# Patient Record
Sex: Male | Born: 1970
Health system: Southern US, Community
[De-identification: ages and names within clinical notes are randomized; demographics above are authoritative.]

## PROBLEM LIST (undated history)

## (undated) DIAGNOSIS — Z9989 Dependence on other enabling machines and devices: Secondary | ICD-10-CM

## (undated) DIAGNOSIS — K200 Eosinophilic esophagitis: Secondary | ICD-10-CM

## (undated) DIAGNOSIS — E785 Hyperlipidemia, unspecified: Secondary | ICD-10-CM

## (undated) DIAGNOSIS — E669 Obesity, unspecified: Secondary | ICD-10-CM

## (undated) DIAGNOSIS — K317 Polyp of stomach and duodenum: Secondary | ICD-10-CM

## (undated) DIAGNOSIS — G4733 Obstructive sleep apnea (adult) (pediatric): Secondary | ICD-10-CM

## (undated) DIAGNOSIS — K219 Gastro-esophageal reflux disease without esophagitis: Secondary | ICD-10-CM

## (undated) DIAGNOSIS — I10 Essential (primary) hypertension: Secondary | ICD-10-CM

## (undated) HISTORY — DX: Obesity, unspecified: E66.9

## (undated) HISTORY — PX: WISDOM TOOTH EXTRACTION: SHX21

## (undated) HISTORY — DX: Polyp of stomach and duodenum: K31.7

## (undated) HISTORY — DX: Obstructive sleep apnea (adult) (pediatric): G47.33

## (undated) HISTORY — DX: Hyperlipidemia, unspecified: E78.5

## (undated) HISTORY — DX: Essential (primary) hypertension: I10

## (undated) HISTORY — DX: Eosinophilic esophagitis: K20.0

## (undated) HISTORY — DX: Gastro-esophageal reflux disease without esophagitis: K21.9

## (undated) HISTORY — DX: Dependence on other enabling machines and devices: Z99.89

---

## 2003-11-25 ENCOUNTER — Emergency Department (HOSPITAL_COMMUNITY): Admission: EM | Admit: 2003-11-25 | Discharge: 2003-11-25 | Payer: Self-pay | Admitting: Emergency Medicine

## 2004-06-12 ENCOUNTER — Ambulatory Visit: Payer: Self-pay | Admitting: Internal Medicine

## 2005-04-13 ENCOUNTER — Ambulatory Visit (HOSPITAL_BASED_OUTPATIENT_CLINIC_OR_DEPARTMENT_OTHER): Admission: RE | Admit: 2005-04-13 | Discharge: 2005-04-13 | Payer: Self-pay | Admitting: Internal Medicine

## 2005-04-13 ENCOUNTER — Encounter: Payer: Self-pay | Admitting: Pulmonary Disease

## 2005-04-16 ENCOUNTER — Encounter: Payer: Self-pay | Admitting: Pulmonary Disease

## 2005-04-23 ENCOUNTER — Ambulatory Visit: Payer: Self-pay | Admitting: Pulmonary Disease

## 2005-04-30 ENCOUNTER — Ambulatory Visit: Payer: Self-pay | Admitting: Pulmonary Disease

## 2005-05-08 ENCOUNTER — Ambulatory Visit: Payer: Self-pay | Admitting: Pulmonary Disease

## 2005-09-27 ENCOUNTER — Ambulatory Visit: Payer: Self-pay | Admitting: Pulmonary Disease

## 2005-10-23 ENCOUNTER — Ambulatory Visit: Payer: Self-pay | Admitting: Internal Medicine

## 2005-11-22 ENCOUNTER — Ambulatory Visit: Payer: Self-pay | Admitting: Internal Medicine

## 2005-12-25 ENCOUNTER — Ambulatory Visit: Payer: Self-pay | Admitting: Pulmonary Disease

## 2006-04-19 ENCOUNTER — Ambulatory Visit: Payer: Self-pay | Admitting: Pulmonary Disease

## 2006-05-10 ENCOUNTER — Ambulatory Visit: Payer: Self-pay | Admitting: Internal Medicine

## 2006-05-24 ENCOUNTER — Ambulatory Visit: Payer: Self-pay | Admitting: Internal Medicine

## 2006-08-08 ENCOUNTER — Ambulatory Visit: Payer: Self-pay | Admitting: Internal Medicine

## 2006-08-08 LAB — CONVERTED CEMR LAB
Cholesterol: 207 mg/dL (ref 0–200)
Glucose, Bld: 99 mg/dL (ref 70–99)
HDL: 39.9 mg/dL (ref >39.0)
TSH: 1.11 microintl units/mL (ref 0.35–5.50)
Triglycerides: 84 mg/dL (ref 0–149)

## 2006-08-15 ENCOUNTER — Encounter: Payer: Self-pay | Admitting: Internal Medicine

## 2006-08-15 LAB — CONVERTED CEMR LAB
Dopamine 24 Hr Urine: 252 mcg/24hr (ref <500)
Epinephrine 24 Hr Urine: 5 mcg/24hr (ref <20)
Metaneph Total, Ur: 294 ug/24hr (ref 90–690)
Norepinephrine 24 Hr Urine: 36 mcg/24hr (ref <80)

## 2007-03-03 ENCOUNTER — Telehealth (INDEPENDENT_AMBULATORY_CARE_PROVIDER_SITE_OTHER): Payer: Self-pay | Admitting: *Deleted

## 2007-07-16 ENCOUNTER — Emergency Department (HOSPITAL_COMMUNITY): Admission: EM | Admit: 2007-07-16 | Discharge: 2007-07-17 | Payer: Self-pay | Admitting: Emergency Medicine

## 2007-07-22 ENCOUNTER — Ambulatory Visit: Payer: Self-pay | Admitting: Internal Medicine

## 2007-07-22 DIAGNOSIS — J45909 Unspecified asthma, uncomplicated: Secondary | ICD-10-CM

## 2007-08-08 ENCOUNTER — Telehealth (INDEPENDENT_AMBULATORY_CARE_PROVIDER_SITE_OTHER): Payer: Self-pay | Admitting: *Deleted

## 2007-10-03 ENCOUNTER — Telehealth (INDEPENDENT_AMBULATORY_CARE_PROVIDER_SITE_OTHER): Payer: Self-pay | Admitting: *Deleted

## 2007-10-14 ENCOUNTER — Ambulatory Visit: Payer: Self-pay | Admitting: Pulmonary Disease

## 2007-10-14 DIAGNOSIS — G4733 Obstructive sleep apnea (adult) (pediatric): Secondary | ICD-10-CM | POA: Insufficient documentation

## 2007-10-15 ENCOUNTER — Ambulatory Visit (HOSPITAL_BASED_OUTPATIENT_CLINIC_OR_DEPARTMENT_OTHER): Admission: RE | Admit: 2007-10-15 | Discharge: 2007-10-15 | Payer: Self-pay | Admitting: Pulmonary Disease

## 2007-10-15 ENCOUNTER — Encounter: Payer: Self-pay | Admitting: Pulmonary Disease

## 2007-10-23 ENCOUNTER — Ambulatory Visit: Payer: Self-pay | Admitting: Pulmonary Disease

## 2007-10-31 ENCOUNTER — Telehealth: Payer: Self-pay | Admitting: Pulmonary Disease

## 2007-10-31 ENCOUNTER — Telehealth (INDEPENDENT_AMBULATORY_CARE_PROVIDER_SITE_OTHER): Payer: Self-pay | Admitting: *Deleted

## 2007-10-31 ENCOUNTER — Encounter (INDEPENDENT_AMBULATORY_CARE_PROVIDER_SITE_OTHER): Payer: Self-pay | Admitting: *Deleted

## 2007-11-18 ENCOUNTER — Ambulatory Visit: Payer: Self-pay | Admitting: Pulmonary Disease

## 2009-01-25 ENCOUNTER — Ambulatory Visit: Payer: Self-pay | Admitting: Internal Medicine

## 2009-01-25 DIAGNOSIS — F429 Obsessive-compulsive disorder, unspecified: Secondary | ICD-10-CM | POA: Insufficient documentation

## 2009-01-25 DIAGNOSIS — E785 Hyperlipidemia, unspecified: Secondary | ICD-10-CM

## 2009-01-25 LAB — CONVERTED CEMR LAB: HDL goal, serum: 40 mg/dL

## 2009-02-06 LAB — CONVERTED CEMR LAB: TSH: 2 microintl units/mL (ref 0.35–5.50)

## 2009-02-08 ENCOUNTER — Encounter (INDEPENDENT_AMBULATORY_CARE_PROVIDER_SITE_OTHER): Payer: Self-pay | Admitting: *Deleted

## 2009-02-23 ENCOUNTER — Encounter: Payer: Self-pay | Admitting: Internal Medicine

## 2009-02-23 ENCOUNTER — Telehealth (INDEPENDENT_AMBULATORY_CARE_PROVIDER_SITE_OTHER): Payer: Self-pay | Admitting: *Deleted

## 2010-01-18 ENCOUNTER — Encounter: Payer: Self-pay | Admitting: Internal Medicine

## 2010-04-26 ENCOUNTER — Telehealth: Payer: Self-pay | Admitting: Family

## 2010-04-26 ENCOUNTER — Ambulatory Visit: Payer: Self-pay | Admitting: Family

## 2010-04-26 DIAGNOSIS — H6690 Otitis media, unspecified, unspecified ear: Secondary | ICD-10-CM | POA: Insufficient documentation

## 2010-05-01 ENCOUNTER — Telehealth: Payer: Self-pay | Admitting: Family

## 2010-05-02 ENCOUNTER — Telehealth: Payer: Self-pay | Admitting: Family

## 2010-07-04 ENCOUNTER — Ambulatory Visit
Admission: RE | Admit: 2010-07-04 | Discharge: 2010-07-04 | Payer: Self-pay | Source: Home / Self Care | Attending: Internal Medicine | Admitting: Internal Medicine

## 2010-07-04 ENCOUNTER — Ambulatory Visit: Admit: 2010-07-04 | Payer: Self-pay | Admitting: Internal Medicine

## 2010-07-04 DIAGNOSIS — J069 Acute upper respiratory infection, unspecified: Secondary | ICD-10-CM | POA: Insufficient documentation

## 2010-07-04 DIAGNOSIS — J45901 Unspecified asthma with (acute) exacerbation: Secondary | ICD-10-CM | POA: Insufficient documentation

## 2010-07-04 NOTE — Progress Notes (Signed)
Summary: Call A Nurse  Phone Note Call from Patient   Caller: [name]  Call a Nurse 04/29/10 Call For: Bryan Craze, NP Summary of Call: Patient calling. States he was seen in office 04/26/10 by Dr.Sullivan for cough, congestion. States was dx with ear infection. States was prescribed Amoxicllin 04/26/10. Patient states cough and congestion are worse. States expectorated bloody sputum 04/29/10 X 4. Afebrile. States mild wheezing. Patient advied to be seen in UC. Patient agreeable. States will go to [facility] for evaluation Initial call taken by: Mervin Kung CMA Duncan Dull),  May 01, 2010 4:54 PM  Follow-up for Phone Call        Called patient's home to follow up with patient.  Spoke with pt's wife, was given cell number [phone].  No answer on cell, left message for patient to return our call.   Follow-up by: Lemont Fillers FNP,  May 01, 2010 5:06 PM

## 2010-07-04 NOTE — Medication Information (Signed)
Summary: Care Consideration for Advair/[employer]  Care Consideration for Advair/[employer]   Imported By: Sherian Rein 02/24/2010 14:27:04  _____________________________________________________________________  External Attachment:    Type:   Image     Comment:   External Document

## 2010-07-04 NOTE — Progress Notes (Signed)
  Spoke to pharmacist at [facility] and gave verbal rx for Cipro.    ---- Converted from flag ---- ---- 04/26/2010 9:28 AM, Mervin Kung CMA (AAMA) wrote: Failed NewRx: Problem in sending NewRx Message to Central.;  Dr Name: Lemont Fillers, Problem in sending NewRx message to Central.;  Pharmacy: [facility] [phone]*;  Medication: AMOXICILLIN 500 MG CAPS;  Instructions: 2 caps by mouth three times a day for 10 days;  Quantity: 60 Capsule;  Refills: 0;  Entry Date: [id number];  Transaction ID: [id number];   ------------------------------  Phone Note Outgoing Call   Call placed by: Lemont Fillers FNP,  April 26, 2010 11:54 AM Call placed to: pharmacy Summary of Call: confirmed that they have rx for amoxicillin 500mg  2 caps by mouth three times a day x 10 days, not cipro. Initial call taken by: Lemont Fillers FNP,  April 26, 2010 11:55 AM

## 2010-07-04 NOTE — Assessment & Plan Note (Signed)
Summary: congested.cbs--Rm 5   Vital Signs:  Patient profile:   40 year old male Height:      70 inches Weight:      223.25 pounds BMI:     32.15 Temp:     97.9 degrees F oral Pulse rate:   78 / minute Pulse rhythm:   regular Resp:     16 per minute BP sitting:   118 / 86  (right arm) Cuff size:   large  Vitals Entered By: Mervin Kung CMA Duncan Dull) (April 26, 2010 9:03 AM) CC: Pt states he has had head congestion x 1 week. Has productive cough and right ear feels full. Is Patient Diabetic? No Pain Assessment Patient in pain? no      Comments Pt states he is only using Advair once a day. Nicki Guadalajara Fergerson CMA Duncan Dull)  April 26, 2010 9:15 AM    Primary Care Provider:  Lona Kettle  CC:  Pt states he has had head congestion x 1 week. Has productive cough and right ear feels full.Marland Kitchen  History of Present Illness: Mr Mecca is a 39 year old male who presents today for evaluation of chest congestion.  Symptoms started week ago monday.  Nasal congestion with clear nasal discharge.  +  cough productive of yellow sputum.   R ear feels full.   + Lethargy.  Denies fever.  Has tried OTC Dayquil, nasal decongestants- little improvement.  Asthma- reports that he has been using pro air 4-5 times a day.  Feels wheezy/tight.  Allergies: 1)  ! Jonne Ply  Past History:  Past Medical History: Last updated: 01/25/2009 OSA on CPAP asthma ; OCD (Fluoxetine), Dr Evelene Croon Hyperlipidemia  Past Surgical History: Last updated: 01/25/2009 Wisdom Teeth Extraction  Physical Exam  General:  Well-developed,well-nourished,in no acute distress; alert,appropriate and cooperative throughout examination Head:  Normocephalic and atraumatic without obvious abnormalities. No apparent alopecia or balding. Eyes:  PERRLA Ears:  R TM + erythema, mild bulging.  L TM with serous effusion no bulging Mouth:  Oral mucosa and oropharynx without lesions or exudates.  Teeth in good repair. Neck:  No deformities,  masses, or tenderness noted. Lungs:  Normal respiratory effort, chest expands symmetrically. Lungs are clear to auscultation, no crackles or wheezes. Heart:  Normal rate and regular rhythm. S1 and S2 normal without gallop, murmur, click, rub or other extra sounds. Psych:  Cognition and judgment appear intact. Alert and cooperative with normal attention span and concentration. No apparent delusions, illusions, hallucinations   Impression & Recommendations:  Problem # 1:  OTITIS MEDIA, ACUTE, LEFT (ICD-382.9) Assessment New Will plan to treat with amoxicillin.  Pt instructed to use tylenol as needed for pain, call if symptoms worsen or if not improved in 48 hours His updated medication list for this problem includes:    Amoxicillin 500 Mg Caps (Amoxicillin) .Marland Kitchen... 2 caps by mouth three times a day for 10 days  Problem # 2:  ASTHMA (ICD-493.90) Assessment: Unchanged Lung exam WNL.  Continue advair and proair.  Advair samples provided  Lot [id number], exp 05/2011 His updated medication list for this problem includes:    Proair Hfa 108 (90 Base) Mcg/act Aers (Albuterol sulfate) .Marland Kitchen... 2 puffs as needed    Advair Diskus 250-50 Mcg/dose Misc (Fluticasone-salmeterol) ..... One inhalation every 12 hours  gargle and swallow after use  Complete Medication List: 1)  Proair Hfa 108 (90 Base) Mcg/act Aers (Albuterol sulfate) .... 2 puffs as needed 2)  Advair Diskus 250-50 Mcg/dose Misc (Fluticasone-salmeterol) .... One  inhalation every 12 hours  gargle and swallow after use 3)  Prozac 40 Mg Caps (Fluoxetine hcl) .... Take 1 tablet by mouth once a day. 4)  Cpap  5)  Nuvigil 250 Mg Tabs (Armodafinil) .... Take 1 tablet by mouth every morning. 6)  Amoxicillin 500 Mg Caps (Amoxicillin) .... 2 caps by mouth three times a day for 10 days  Patient Instructions: 1)  Call if symptoms worsen or do not improve. 2)  Have a nice Thanksgiving! Prescriptions: ADVAIR DISKUS 250-50 MCG/DOSE MISC  (FLUTICASONE-SALMETEROL) ONE INHALATION EVERY 12 HOURS  GARGLE AND SWALLOW AFTER USE  #2 x 0   Entered and Authorized by:   Lemont Fillers FNP   Signed by:   Lemont Fillers FNP on 04/26/2010   Method used:   Samples Given   RxID:   [id number] AMOXICILLIN 500 MG CAPS (AMOXICILLIN) 2 caps by mouth three times a day for 10 days  #60 x 0   Entered and Authorized by:   Lemont Fillers FNP   Signed by:   Lemont Fillers FNP on 04/26/2010   Method used:   Electronically to        [facility] [phone]* (retail)       [address]       [place], [place]  [address]       Ph: [phone]       Fax: [phone]   RxID:   [id number]    Orders Added: 1)  Est. Patient Level IV [34742]    Current Allergies (reviewed today): ! ASA   Appended Document: congested.cbs--Rm 5 Correction-  Diagnosis should read Right Otitis media, not Left otitis media.

## 2010-07-04 NOTE — Progress Notes (Signed)
Summary: Status Update--  Phone Note Call from Patient Call back at [phone]   Caller: Patient Call For: Marga Melnick MD Summary of Call: patient states he was seen by another doctor  on Saturday- [facility] in [place]  and  he was given a Zpak. He states he was also advised to continue with antibiotics that was prescribed in addition to Zpak. He states his cough has not improved and he has been coughing up blood, there has not been a significant amount. He state that he was diagnosed with Bronchitis, he feels like there has been no change in his symptoms. He would like to know if there is anything additional that he will need to do. He denies temperature and states he is concerned more about the cough.  Initial call taken by: Glendell Docker CMA,  May 02, 2010 4:18 PM  Follow-up for Phone Call        I think that the patient should be re-evaluated since his symptoms are not improving.  He will need a chest x-ray to exclude pneumonia.  If Dr. Alwyn Ren is booked, then I am happy to see him. Follow-up by: Lemont Fillers FNP,  May 03, 2010 8:14 AM  Additional Follow-up for Phone Call Additional follow up Details #1::        Left message on machine to return my call. Nicki Guadalajara Fergerson CMA Duncan Dull)  May 03, 2010 9:10 AM     Additional Follow-up for Phone Call Additional follow up Details #2::    Spoke to pt. He states that he feels his symptoms have stablized at this point. Does not feel that he is getting worse. States that the urgent care doctor he saw mentioned an xray but said to hold off for now. Pt states he will give it a few more days and if he is not better will give [place] a call. Nicki Guadalajara Fergerson CMA Duncan Dull)  May 04, 2010 8:18 AM

## 2010-07-07 ENCOUNTER — Telehealth: Payer: Self-pay | Admitting: Internal Medicine

## 2010-07-10 ENCOUNTER — Telehealth: Payer: Self-pay | Admitting: Internal Medicine

## 2010-07-12 NOTE — Assessment & Plan Note (Signed)
Summary: sinus, cold, chest congestion///sph   Vital Signs:  Patient profile:   40 year old male Weight:      227.8 pounds BMI:     32.80 Temp:     99.0 degrees F oral Pulse rate:   92 / minute Resp:     15 per minute BP sitting:   122 / 78  (left arm) Cuff size:   large  Vitals Entered By: Shonna Chock CMA (July 04, 2010 8:13 AM) CC: Sinuses, cold and chest congestion since 05/2010 (patient seen x 3). [facility] , URI symptoms, Cough, COPD follow-up   Primary Care Provider:  Lona Kettle  CC:  Sinuses, cold and chest congestion since 05/2010 (patient seen x 3). [facility] at [facility] , URI symptoms, Cough, and COPD follow-up.  History of Present Illness:    Intermittent RTI  since "otitis" in mid Dec 2011. He has also  been seen a total of 3 additional times @ UC in [place]. CXray was done for hemoptysis in mid January. Yesterday he was told he may have " pre pneumonia"..  The patient  now reports clear nasal discharge and productive cough, but denies nasal congestion and earache.  Associated symptoms include dyspnea and wheezing.  The patient denies fever.  The patient also reports sneezing and headache.  The patient denies itchy watery eyes.  Risk factors for Strep sinusitis include bilateral facial pain.  The patient denies the following risk factors for Strep sinusitis: tooth pain and tender adenopathy. The patient reports pleuritic chest pain and  streaky hemoptysis.  The patient denies the following symptoms: acid reflux symptoms.  The cough is worse with lying down and cold exposure.  Medication use includes quick relief med , albuterol  3-4X/day daily and controller med, Advair 250/50 only once  daily in am .    Current Medications (verified): 1)  Proair Hfa 108 (90 Base) Mcg/act Aers (Albuterol Sulfate) .... 2 Puffs As Needed 2)  Advair Diskus 250-50 Mcg/dose Misc (Fluticasone-Salmeterol) .... One Inhalation Every 12 Hours  Gargle and Swallow After  Use 3)  Prozac 40 Mg Caps (Fluoxetine Hcl) .... Take 1 Tablet By Mouth Once A Day. 4)  Cpap 5)  Nuvigil 250 Mg Tabs (Armodafinil) .... Take 1 Tablet By Mouth Every Morning. 6)  Singulair 10 Mg Tabs (Montelukast Sodium) .Marland Kitchen.. 1 By Mouth Once Daily 7)  Doxycycline Hyclate 100 Mg Caps (Doxycycline Hyclate) .Marland Kitchen.. 1 By Mouth Two Times A Day, Rx'ed At [facility] 8)  Hydrocodone-Homatropine 5-1.5 Mg/4ml Syrp (Hydrocodone-Homatropine) .Marland Kitchen.. 1 Tablespoon At Night  Allergies: 1)  ! Asa  Review of Systems Psych:  He requests I fill meds now Rxed by Dr Evelene Croon as copay is high. I informed him  I would need statement from Dr Evelene Croon that he is stable on this regimen & no changes anticipated.. Endo:  Complains of weight change; Options for weight loss discussed ( avoidance of HFCS sugar; low glycemic  carb diet) & he was instructed in  label reading .  Physical Exam  General:  well-nourished,in no acute distress; alert,appropriate and cooperative throughout examination Eyes:  Slight OD scleral bleeding Ears:  External ear exam shows no significant lesions or deformities.  Otoscopic examination reveals clear canals, tympanic membranes are intact bilaterally without bulging, retraction, inflammation or discharge. Hearing is grossly normal bilaterally. Some wax bilaterally Nose:  External nasal examination shows no deformity or inflammation. Nasal mucosa are  dry & erythematous without lesions or exudates. Mouth:  Oral mucosa and oropharynx without lesions or exudates.  Teeth in good repair. Lungs:  Normal respiratory effort, chest expands symmetrically. Lungs : diffuse, homogenous coarse rhonchi &  wheezes. Heart:  Normal rate and regular rhythm. S1 and S2 normal without gallop, murmur, click, rub. Extremities:  No clubbing, cyanosis, edema.   Cervical Nodes:  No lymphadenopathy noted Axillary Nodes:  No palpable lymphadenopathy   Impression & Recommendations:  Problem # 1:  BRONCHITIS-ACUTE (ICD-466.0) R/O  PNA based on his understanding of Xray findings from UC The following medications were removed from the medication list:    Amoxicillin 500 Mg Caps (Amoxicillin) .Marland Kitchen... 2 caps by mouth three times a day for 10 days His updated medication list for this problem includes:    Proair Hfa 108 (90 Base) Mcg/act Aers (Albuterol sulfate) .Marland Kitchen... 2 puffs as needed    Advair Diskus 250-50 Mcg/dose Misc (Fluticasone-salmeterol) ..... One inhalation every 12 hours  gargle and swallow after use    Singulair 10 Mg Tabs (Montelukast sodium) .Marland Kitchen... 1 by mouth once daily    Doxycycline Hyclate 100 Mg Caps (Doxycycline hyclate) .Marland Kitchen... 1 by mouth two times a day, [facility] at [facility]    Hydrocodone-homatropine 5-1.5 Mg/30ml Syrp (Hydrocodone-homatropine) .Marland Kitchen... 1 tablespoon at night    Dulera 200-5 Mcg/act Aero (Mometasone furo-formoterol fum) .Marland Kitchen... 1-2 puffs every 12 hrs as needed ; gargle after use (inplace of advair)    Singulair 10 Mg Tabs (Montelukast sodium) .Marland Kitchen... 1 once daily  Orders: T-2 View CXR (71020TC)  Problem # 2:  ASTHMA NOS W/ACUTE EXACERBATION (ICD-493.92) Pathophysiology of asthma & assessment of control ( "Rules of Two") discussed His updated medication list for this problem includes:    Proair Hfa 108 (90 Base) Mcg/act Aers (Albuterol sulfate) .Marland Kitchen... 2 puffs as needed    Advair Diskus 250-50 Mcg/dose Misc (Fluticasone-salmeterol) ..... One inhalation every 12 hours  gargle and swallow after use    Singulair 10 Mg Tabs (Montelukast sodium) .Marland Kitchen... 1 by mouth once daily    Dulera 200-5 Mcg/act Aero (Mometasone furo-formoterol fum) .Marland Kitchen... 1-2 puffs every 12 hrs as needed ; gargle after use (inplace of advair)    Singulair 10 Mg Tabs (Montelukast sodium) .Marland Kitchen... 1 once daily    Prednisone 20 Mg Tabs (Prednisone) .Marland Kitchen... 1 two times a day with food  Orders: T-2 View CXR (71020TC)  Problem # 3:  URI (ICD-465.9)  His updated medication list for this problem includes:    Hydrocodone-homatropine 5-1.5 Mg/72ml  Syrp (Hydrocodone-homatropine) .Marland Kitchen... 1 tablespoon at night  Complete Medication List: 1)  Proair Hfa 108 (90 Base) Mcg/act Aers (Albuterol sulfate) .... 2 puffs as needed 2)  Advair Diskus 250-50 Mcg/dose Misc (Fluticasone-salmeterol) .... One inhalation every 12 hours  gargle and swallow after use 3)  Prozac 40 Mg Caps (Fluoxetine hcl) .... Take 1 tablet by mouth once a day. 4)  Cpap  5)  Nuvigil 250 Mg Tabs (Armodafinil) .... Take 1 tablet by mouth every morning. 6)  Singulair 10 Mg Tabs (Montelukast sodium) .Marland Kitchen.. 1 by mouth once daily 7)  Doxycycline Hyclate 100 Mg Caps (Doxycycline hyclate) .Marland Kitchen.. 1 by mouth two times a day, [facility] at [facility] 8)  Hydrocodone-homatropine 5-1.5 Mg/24ml Syrp (Hydrocodone-homatropine) .Marland Kitchen.. 1 tablespoon at night 9)  Dulera 200-5 Mcg/act Aero (Mometasone furo-formoterol fum) .Marland Kitchen.. 1-2 puffs every 12 hrs as needed ; gargle after use (inplace of advair) 10)  Singulair 10 Mg Tabs (Montelukast sodium) .Marland Kitchen.. 1 once daily 11)  Prednisone 20 Mg Tabs (Prednisone) .Marland KitchenMarland KitchenMarland Kitchen 1  two times a day with food  Patient Instructions: 1)  Neti pot once daily - two times a day as needed for congestion.  2)  Drink as much NON dairy  fluid as you can tolerate for the next few days. Finish Doxycycline. Prescriptions: HYDROCODONE-HOMATROPINE 5-1.5 MG/5ML SYRP (HYDROCODONE-HOMATROPINE) 1 tablespoon at night  #120 cc x 0   Entered and Authorized by:   Marga Melnick MD   Signed by:   Marga Melnick MD on 07/04/2010   Method used:   Print then Give to Patient   RxID:   [id number] PREDNISONE 20 MG TABS (PREDNISONE) 1 two times a day with food  #14 x 0   Entered and Authorized by:   Marga Melnick MD   Signed by:   Marga Melnick MD on 07/04/2010   Method used:   Print then Give to Patient   RxID:   [id number] SINGULAIR 10 MG TABS (MONTELUKAST SODIUM) 1 once daily  #30 x 5   Entered and Authorized by:   Marga Melnick MD   Signed by:   Marga Melnick MD on 07/04/2010   Method  used:   Print then Give to Patient   RxID:   [id number] DULERA 200-5 MCG/ACT AERO (MOMETASONE FURO-FORMOTEROL FUM) 1-2 puffs every 12 hrs as needed ; gargle after use (inplace of Advair)  #1 x 5   Entered and Authorized by:   Marga Melnick MD   Signed by:   Marga Melnick MD on 07/04/2010   Method used:   Print then Give to Patient   RxID:   [id number]    Orders Added: 1)  Est. Patient Level IV [63016] 2)  T-2 View CXR [71020TC]

## 2010-07-12 NOTE — Progress Notes (Signed)
Summary: RX clarification  Phone Note Refill Request Message from:  Pharmacy on July 07, 2010 10:13 AM  Refills Requested: Medication #1:  HYDROCODONE-HOMATROPINE 5-1.5 MG/5ML SYRP 1 tablespoon at night Pharmacy called about the above prescription and would like to confirm if the patient should take one tablespoon as written or should the prescription be for one teaspoon. Please advise.  Initial call taken by: Lucious Groves CMA,  July 07, 2010 10:13 AM  Follow-up for Phone Call        Per MD: patient will take 1 tsp q 6hrs as needed and may cause sedation.   Pharmacist notified.  Follow-up by: Lucious Groves CMA,  July 07, 2010 11:24 AM

## 2010-07-18 ENCOUNTER — Telehealth: Payer: Self-pay | Admitting: Internal Medicine

## 2010-07-20 NOTE — Progress Notes (Signed)
Summary: Cough med  Phone Note Call from Patient Call back at [phone]   Summary of Call: Patient notes that he is using the cough med once in the AM and once in PM. He notes that it does help, but seems to not last long enough. Patient doesn't want to abuse the med, but notes that he continues to cough. She he come in or is there something else he can try? Please advise. Initial call taken by: Lucious Groves CMA,  July 10, 2010 2:01 PM  Follow-up for Phone Call        renew cough syrup ; 1 tsp every 6 hrs as needed  .120cc. OVINB Follow-up by: Marga Melnick MD,  July 10, 2010 3:27 PM  Additional Follow-up for Phone Call Additional follow up Details #1::        Patient notified. Additional Follow-up by: Lucious Groves CMA,  July 10, 2010 4:00 PM    New/Updated Medications: HYDROMET 5-1.5 MG/5ML SYRP (HYDROCODONE-HOMATROPINE) 1 tsp every 6 hrs as needed Prescriptions: HYDROMET 5-1.5 MG/5ML SYRP (HYDROCODONE-HOMATROPINE) 1 tsp every 6 hrs as needed  #120 cc x 0   Entered and Authorized by:   Marga Melnick MD   Signed by:   Marga Melnick MD on 07/10/2010   Method used:   Printed then faxed to ...       [facility] [address] [phone]* (retail)       [address]       [place], [place]  [address]       Ph: [phone]       Fax: [phone]   RxID:   [id number]

## 2010-07-26 NOTE — Progress Notes (Signed)
Summary: FYI--Feeling better  Phone Note Call from Patient   Summary of Call: Patient called with FYI that he is much better and his cough has gone away. Thanks for everything you've done.  Initial call taken by: Lucious Groves CMA,  July 18, 2010 8:08 AM

## 2010-09-26 ENCOUNTER — Telehealth: Payer: Self-pay | Admitting: Pulmonary Disease

## 2010-09-26 NOTE — Telephone Encounter (Signed)
Pls advise.  

## 2010-09-26 NOTE — Telephone Encounter (Signed)
Spouse requests that dr clance see pt asap for asthma. [facility]c has seen pt for sleep. i explained that we would need her to call dr hopper and get a referral as this is "normally" how kc sees pulm consults. Caller says that she spoke to libby and was told that we could send a note to kc re: this req. pls advise as the 1st avail cons avail. Is 10/17/10. (also advise if this will be a new consult- if not- pt can be seen sooner since spouse wants pt in this week asap). Call home [phone]- ok to leave msg per spouse tina.

## 2010-09-27 ENCOUNTER — Telehealth: Payer: Self-pay | Admitting: *Deleted

## 2010-09-27 MED ORDER — MONTELUKAST SODIUM 10 MG PO TABS: 10.0000 mg | ORAL_TABLET | Freq: Every day | ORAL | Status: DC

## 2010-09-27 MED ORDER — MOMETASONE FURO-FORMOTEROL FUM 200-5 MCG/ACT IN AERO: 2.0000 | INHALATION_SPRAY | Freq: Two times a day (BID) | RESPIRATORY_TRACT | Status: DC | PRN

## 2010-09-27 NOTE — Telephone Encounter (Signed)
Called and spoke with pt's wife, Inetta Fermo.  SCheduled pt to see [facility] next Wed 10-04-2010 at 4pm.  Wife aware for pt to arrive at 3:50 to fill out paperwork.

## 2010-09-27 NOTE — Telephone Encounter (Signed)
Would be happy to see as a pulmonary self referral (new evaluation).  Please get with megan and see when this can be done.  Unlikely this week, but you never know

## 2010-09-27 NOTE — Telephone Encounter (Signed)
Refill both X 6 months please

## 2010-09-27 NOTE — Telephone Encounter (Signed)
[place], the first available consult with [facility] is not until 10/17/10. Do you think we could work him in sooner that this? He has some openings before this date, but just not 30 min slot. Pls advise thanks

## 2010-09-27 NOTE — Telephone Encounter (Signed)
Spoke w/ pt informed that he could just called pulmonary and request another physician for second opinion. Also refills sent to pharmacy.

## 2010-10-02 ENCOUNTER — Encounter: Payer: Self-pay | Admitting: Pulmonary Disease

## 2010-10-03 HISTORY — PX: WRIST SURGERY: SHX841

## 2010-10-04 ENCOUNTER — Institutional Professional Consult (permissible substitution): Payer: Self-pay | Admitting: Pulmonary Disease

## 2010-10-17 ENCOUNTER — Ambulatory Visit (HOSPITAL_BASED_OUTPATIENT_CLINIC_OR_DEPARTMENT_OTHER)
Admission: RE | Admit: 2010-10-17 | Discharge: 2010-10-17 | Disposition: A | Discharge status: Home / Self Care | Payer: Managed Care, Other (non HMO) | Source: Ambulatory Visit | Attending: Orthopedic Surgery | Admitting: Orthopedic Surgery

## 2010-10-17 DIAGNOSIS — M6740 Ganglion, unspecified site: Secondary | ICD-10-CM | POA: Insufficient documentation

## 2010-10-17 DIAGNOSIS — Z01812 Encounter for preprocedural laboratory examination: Secondary | ICD-10-CM | POA: Insufficient documentation

## 2010-10-17 DIAGNOSIS — J45909 Unspecified asthma, uncomplicated: Secondary | ICD-10-CM | POA: Insufficient documentation

## 2010-10-17 NOTE — Procedures (Signed)
[name], Barber NO.:  [id number]   MEDICAL RECORD NO.:  [id number]          PATIENT TYPE:  OUT   LOCATION:  SLEEP CENTER                 FACILITY[facility]  [facility]   PHYSICIAN:  Barbaraann Share, MD,FCCPDATE OF BIRTH:  February 20[birth year] 1973   DATE OF STUDY:  10/15/2007                            NOCTURNAL POLYSOMNOGRAM   REFERRING PHYSICIAN:  Barbaraann Share, MD,FCCP   REFERRING PHYSICIAN:  Barbaraann Share, MD   INDICATION FOR STUDY:  Hypersomnia with sleep apnea.  The patient is  having difficulty with CPAP and needs pressure optimization.   EPWORTH SLEEPINESS SCORE:  9.   SLEEP ARCHITECTURE:  The patient had total sleep time of 290 minutes,  however had minimal slow-wave sleep and never achieved REM.  Sleep onset  latency was normal and sleep efficiency was decreased at 82%.   RESPIRATORY DATA:  The patient underwent formal CPAP titration and was  tried on numerous types of masks prior to the procedure.  He ultimately  chose his home mask, which is a Child psychotherapist.  During this study a  chin strap had to be added due to mouth opening.  At 3:30 a.m. the  patient was switched over to a full-face Quattro mask because of  discomfort from the chin strap.  The patient's pressure was started at 5  cmH2O and ultimately increased to 11 cm for snoring as well as  respiratory effort-related arousals.   OXYGEN DATA:  There was transient O2 desaturation to 93%.   CARDIAC DATA:  No clinically significant arrhythmias were noted.   MOVEMENT-PARASOMNIA:  No periodic leg movements or abnormal behaviors  noted.   IMPRESSIONS-RECOMMENDATIONS:  CPAP titration reveals good control of  sleep-disordered breathing with 11 cm of CPAP pressure.  The patient was  tried on different masks and had difficulty with the Nasal Swift because  of mouth opening.  He could not tolerate a chin strap because of  discomfort and ultimately ended up on a Quattro full-face mask.  This  will have to be  discussed with the patient to determine which CPAP mask  will be adequate for  him.  The patient should also be encouraged to work on weight loss.  It  should be noted the patient never achieved REM during then night.      Barbaraann Share, MD,FCCP  Diplomate, American Board of Sleep  Medicine  Electronically Signed     [facility]  D:  10/24/2007 06:35:16  T:  10/24/2007 07:15:55  Job:  [id number]

## 2010-10-19 NOTE — Op Note (Signed)
NAME:  Bryan Barber, Partain ERIC                ACCOUNT NO.:  [id number]  MEDICAL RECORD NO.:  [id number]           PATIENT TYPE:  LOCATION:                                FACILITY:  [facility]  PHYSICIAN:  Katy Fitch. Patrizia Paule, M.D. DATE OF BIRTH:  03-Jun-1971  DATE OF PROCEDURE:  10/17/2010 DATE OF DISCHARGE:                              OPERATIVE REPORT   PREOPERATIVE DIAGNOSIS:  Complex painful volar myxoid cyst adjacent to radial artery.  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  OPERATION:  Resection of complex radial artery adventitial myxoid cyst with complex hemostasis of carpal arterial branches and dorsal branch of radial artery.  OPERATING SURGEON:  Katy Fitch. Daunte Oestreich, M.D.  ASSISTANT:  Jonni Sanger, P.A.  ANESTHESIA:  General by LMA.  SUPERVISING ANESTHESIOLOGIST:  Janetta Hora. Gelene Mink, M.D.  INDICATIONS:  Bryan Barber is a 40 year old gentleman employed by [employer].  He was referred through the courtesy of Dr. Marga Melnick for evaluation and management of a tense, painful right volar ganglion cyst.  We had a detailed informed consent in the office during which we explained that these cysts can be very complex.  At times, they arise from the volar wrist capsule ligaments and other times they are an arterial adventitial degenerative myxoid cyst.  He was advised preoperatively that there was always a significant chance of recurrence of these cysts.  At times, they involve the arterial wall and require complex dissection of the artery.  After informed consent, he was brought to the operating at this time.  PROCEDURE:  Andi Devon was brought to Room 2 of the [facility] and placed in supine position upon the operating room table.  Following an anesthesia consult with Dr. Gelene Mink, general anesthesia by LMA technique was  recommended and accepted.  Under Dr. Thornton Dales strict supervision, anesthesia was induced followed by routine Betadine scrub and paint of the right upper extremity.  1 g of Ancef was administered as an IV prophylactic antibiotic due to possible joint entry.  After routine surgical time-out, the procedure commenced with exsanguination of right arm with Esmarch bandage with inflation of arterial tourniquet to 220 mmHg.  The procedure commenced with a longitudinal incision exposing the cyst in the subcutaneous region.  Care was taken to identify the radial superficial sensory branches, flexor carpi radialis, and the tendon of the first dorsal compartment.  The cyst was circumferentially dissected taking care to elevate the superficial sensory branches off the wall of the cyst.  There were several superficial veins that were likewise gently elevated off the cyst.  The cyst was directly involving the wall of the radial artery. Therefore, the cyst was drained of its mucinous contents for a total volume of approximately 5 mL of the mucinous material.  We then proceeded with a careful dissection using DeBakey forceps and microtenotomy scissors isolating the wall of the cyst.  It was immediately apparent, Mr. Troiani had a variant anatomy with a dominant superficial branch of the radial artery to the thenar eminence and palm, and  a smaller caliber dorsal branch going deep to the tendons of the first dorsal compartment.  There were several carpal branches that exited to the scaphoid from the superficial branch.  These were carefully dissected and preserved.  In the manner of Lister, the wall of the cyst was left intact so as not to disturb the intimal margins of the radial artery or its superficial dorsal branches.  After the cyst was completely excised, hemostasis was achieved under saline with bipolar cautery of incidental venous bleeding.  The tourniquet was released and  immediately we noted punctate bleeding from several locations adjacent to the carpal branches.  The tourniquet was then reapplied after exsanguination of the limb, followed by careful dissection of each branch and use of Vicryl suture ligation.  We then released the tourniquet a second time and identified difficult bleeding from the bifurcation of the radial artery where there was apparently several small perforating branches that were in communication with the intimal surface of the radial artery.  At this point, the choice was to either reconstruct the radial artery versus ligate the radial artery.  With the distal branch of the radial artery through the snuffbox occluded by direct pressure, we had immediate cap refill to the thumb and all fingers upon tourniquet release.  There would be no negative consequences of ligation; however, it appeared that we could preserve the dorsal branch by simply using Ligaclips to control the side branch bleeding.  Therefore, a total of 7 Ligaclips were applied to the margins of the radial artery effectively controlling the bleeding.  The wound was filled with saline and the tourniquet released once again. We cycled the tourniquet up and down several times in an effort to be sure that we had adequate control of any sidewall bleeding from the radial artery.  There were no apparent bleeding points following application of Ligaclips.  The wound was irrigated thoroughly followed by repair of the skin with subcutaneous 4-0 Vicryl and intradermal 3-0 Prolene.  The wound was dressed with Steri-Strips, Xeroflo, a gauze bolster to prevent dead space fluid collection, and a volar plaster splint maintaining the wrist in 10 degrees of dorsiflexion.  For aftercare, Mr. Sigmund was provided a prescription for Vicodin 5 mg one p.o. q.4-6 h. p.r.n. pain 20 tablets without refill.  He is noted to be allergic to aspirin and will not use either aspirin or  ibuprofen products.     Katy Fitch Bryan Barber, M.D.     RVS/MEDQ  D:  10/17/2010  T:  10/17/2010  Job:  [id number]  cc:   Titus Dubin. Alwyn Ren, MD,FACP,FCCP  Electronically Signed by Josephine Igo M.D. on 10/19/2010 09:37:34 AM

## 2010-10-20 NOTE — Procedures (Signed)
NAMESLOANE, JUNKIN NO.:  [id number]   MEDICAL RECORD NO.:  [id number]          PATIENT TYPE:  OUT   LOCATION:  SLEEP CENTER                 FACILITY[facility]  [facility]   PHYSICIAN:  Marcelyn Bruins, M.D. [facility] DATE OF BIRTH:  [birth year]   DATE OF STUDY:  04/13/2005                              NOCTURNAL POLYSOMNOGRAM   REFERRING PHYSICIAN:  Dr. Lona Kettle.   INDICATION FOR STUDY:  hypersomnia with sleep apnea.   EPWORTH SLEEPINESS SCORE:  5   SLEEP ARCHITECTURE:  The patient had a total sleep time of 252 minutes with  significantly decreased REM and slow wave sleep.  Sleep onset latency was  mildly prolonged; however, REM onset was very prolonged.  Sleep efficiency  was only 64%.  The patient did not maintain consistent sleep until after  1:30 a.m.   RESPIRATORY DATA:  The patient was found to have 103 hypopneas and 143  apneas for a respiratory disturbance index of 60 events per hour.  Events  were not positional; however, there was moderate snoring noted.  The patient  did not meet split-night criteria secondary to inconsistent sleep until 1:30  a.m.   OXYGEN DATA:  The patient had O2 desaturation as low as 76% associated with  his obstructive events.   CARDIAC DATA:  No clinically significant cardiac arrhythmias.   MOVEMENT/PARASOMNIAS:  No significant events.   IMPRESSION/RECOMMENDATION:  1.  Severe obstructive sleep apnea/hypopnea syndrome with a respiratory      disturbance index of 60 events per hour and O2 desaturation as low as      76%.  The patient did not meet split-night criteria secondary to      inconsistent sleep.  Treatment for this degree of sleep apnea should      focus primarily on weight loss if appropriate as well as continuous      positive airway pressure.           ______________________________  Marcelyn Bruins, M.D. [facility]  Diplomate, American Board of Sleep  Medicine     KC/MEDQ  D:  04/24/2005 07:49:01  T:  04/24/2005 12:02:10  Job:   [id number]

## 2010-11-21 ENCOUNTER — Ambulatory Visit: Payer: Managed Care, Other (non HMO) | Admitting: Pulmonary Disease

## 2010-11-21 ENCOUNTER — Encounter: Payer: Self-pay | Admitting: Pulmonary Disease

## 2010-11-21 ENCOUNTER — Ambulatory Visit (INDEPENDENT_AMBULATORY_CARE_PROVIDER_SITE_OTHER): Payer: Managed Care, Other (non HMO) | Admitting: Pulmonary Disease

## 2010-11-21 DIAGNOSIS — G4733 Obstructive sleep apnea (adult) (pediatric): Secondary | ICD-10-CM

## 2010-11-21 DIAGNOSIS — Z72821 Inadequate sleep hygiene: Secondary | ICD-10-CM

## 2010-11-21 DIAGNOSIS — F518 Other sleep disorders not due to a substance or known physiological condition: Secondary | ICD-10-CM

## 2010-11-21 NOTE — Patient Instructions (Signed)
Will have your dme check the functioning of your current machine, and get you a new mask Will reoptimize your pressure on auto for 2 weeks. Work on getting at least 6hrs of sleep a night No computer after 10pm, limit caffeine as much as possible after 10am.  Work on weight loss Will call after I receive download off your machine.  Let me know if you do not hear from me within 3-4 days of getting download picked up.

## 2010-11-21 NOTE — Progress Notes (Signed)
  Subjective:    Patient ID: Bryan Barber, male    DOB: 05-26-1971, 40 y.o.   MRN: [id number]  HPI The pt is a 39y/o male who comes in as a self referral for management of osa.  He was diagnosed with severe osa in 2009, and started on cpap.  I have not seen him since in followup (3 yrs), but he has at least been wearing his cpap machine.  He currently uses nasal pillows, and his mask has not been changed in 94yrs.  He denies his machine having any functioning issues.  His weight is up about 25 pounds since his last visit though.  He is going to bed btw 11p-2am, and arises 6am-9am.  He does not feel rested upon arising, and having a lot of daytime sleepiness even with provigil and significant caffeine intake.  He denies RLS symptoms, but his wife does note kicking during the night.     Review of Systems  Constitutional: Negative for fever and unexpected weight change.  HENT: Positive for trouble swallowing. Negative for ear pain, nosebleeds, congestion, sore throat, rhinorrhea, sneezing, dental problem, postnasal drip and sinus pressure.   Eyes: Negative for redness and itching.  Respiratory: Positive for cough and shortness of breath. Negative for chest tightness and wheezing.   Cardiovascular: Negative for palpitations and leg swelling.  Gastrointestinal: Negative for nausea and vomiting.  Genitourinary: Negative for dysuria.  Musculoskeletal: Negative for joint swelling.  Skin: Negative for rash.  Neurological: Negative for headaches.  Hematological: Does not bruise/bleed easily.  Psychiatric/Behavioral: Negative for dysphoric mood. The patient is not nervous/anxious.        Objective:   Physical Exam Constitutional:  Obese male, no acute distress  HENT:  Nares patent without discharge, but deviation to left  Oropharynx without exudate, palate and uvula are elongated with tonsillar hypertrophy  Eyes:  Perrla, eomi, no scleral icterus  Neck:  No JVD, no TMG  Cardiovascular:  Normal  rate, regular rhythm, no rubs or gallops.  No murmurs        Intact distal pulses  Pulmonary :  Normal breath sounds, no stridor or respiratory distress   No rales, rhonchi, or wheezing  Abdominal:  Soft, nondistended, bowel sounds present.  No tenderness noted.   Musculoskeletal:  No lower extremity edema noted.  Lymph Nodes:  No cervical lymphadenopathy noted  Skin:  No cyanosis noted  Neurologic:  Alert, appropriate, moves all 4 extremities without obvious deficit.         Assessment & Plan:

## 2010-11-25 NOTE — Assessment & Plan Note (Signed)
The pt has a h/o severe osa, and his weight has increased significantly since that time. He is having nonrestorative sleep and EDS despite wearing cpap.  He is overdue for a new mask, and will also check the functioning of his machine.  More importantly, will need to re-optimize his pressure now that his weight has increased.

## 2010-11-25 NOTE — Assessment & Plan Note (Signed)
The pt needs to work on a consistent sleep schedule, and getting at least 7hrs of sleep a night.

## 2010-12-12 ENCOUNTER — Institutional Professional Consult (permissible substitution): Payer: Self-pay | Admitting: Pulmonary Disease

## 2011-01-30 ENCOUNTER — Ambulatory Visit (INDEPENDENT_AMBULATORY_CARE_PROVIDER_SITE_OTHER): Payer: Managed Care, Other (non HMO) | Admitting: Family Medicine

## 2011-01-30 VITALS — BP 110/78 | HR 98 | Temp 97.4°F | Wt 228.0 lb

## 2011-01-30 DIAGNOSIS — H6690 Otitis media, unspecified, unspecified ear: Secondary | ICD-10-CM

## 2011-01-30 MED ORDER — BENZONATATE 200 MG PO CAPS: 200.0000 mg | ORAL_CAPSULE | Freq: Three times a day (TID) | ORAL | Status: AC | PRN

## 2011-01-30 MED ORDER — AMOXICILLIN 500 MG PO CAPS: 500.0000 mg | ORAL_CAPSULE | Freq: Two times a day (BID) | ORAL | Status: AC

## 2011-01-30 NOTE — Assessment & Plan Note (Signed)
Pt w/ R OM.  Start amox.  Reviewed supportive care and red flags that should prompt return.  Pt expressed understanding and is in agreement w/ plan.  

## 2011-01-30 NOTE — Progress Notes (Signed)
  Subjective:    Patient ID: Bryan Barber, male    DOB: 02/11/1971, 40 y.o.   MRN: [id number]  HPI URI- a few weeks ago went to [facility] and was prescribed Doxy for a sinus infxn.  Did not complete abx as directed.  1 week after that developed sneezing, dry, itchy throat, nasal drainage- finished Doxy at that time.  Now w/ R ear pain.  sxs started 3-4 days ago.  Has since developed associated cough, nasal congestion.  No fevers.  + sick contacts.  Not on any seasonal allergy meds- has hx of allergies.  Review of Systems For ROS see HPI     Objective:   Physical Exam  Vitals reviewed. Constitutional: He appears well-developed and well-nourished. No distress.  HENT:  Head: Normocephalic and atraumatic.  Nose: Nose normal.  Mouth/Throat: Oropharynx is clear and moist. No oropharyngeal exudate.       R TM dull, erythematous, poor landmarks L TM WNL  Eyes: Conjunctivae and EOM are normal. Pupils are equal, round, and reactive to light.  Neck: Normal range of motion. Neck supple.  Pulmonary/Chest: Effort normal and breath sounds normal. No respiratory distress. He has no wheezes. He has no rales.  Lymphadenopathy:    He has no cervical adenopathy.          Assessment & Plan:

## 2011-01-30 NOTE — Patient Instructions (Signed)
Take the Amoxicillin twice a day as directed for the ear infection- take w/ food to avoid upset stomach Take the cough pills as directed Add OTC Claritin or Zyrtec daily for the seasonal allergy component Drink plenty of fluids REST! Hang in there!!

## 2011-02-11 ENCOUNTER — Other Ambulatory Visit: Payer: Self-pay | Admitting: Pulmonary Disease

## 2011-02-11 DIAGNOSIS — G4733 Obstructive sleep apnea (adult) (pediatric): Secondary | ICD-10-CM

## 2011-02-27 ENCOUNTER — Ambulatory Visit (INDEPENDENT_AMBULATORY_CARE_PROVIDER_SITE_OTHER): Payer: Managed Care, Other (non HMO) | Admitting: Internal Medicine

## 2011-02-27 ENCOUNTER — Encounter: Payer: Self-pay | Admitting: Internal Medicine

## 2011-02-27 VITALS — BP 114/78 | HR 80 | Temp 98.3°F | Wt 228.8 lb

## 2011-02-27 DIAGNOSIS — R8290 Unspecified abnormal findings in urine: Secondary | ICD-10-CM

## 2011-02-27 DIAGNOSIS — R1310 Dysphagia, unspecified: Secondary | ICD-10-CM

## 2011-02-27 DIAGNOSIS — R5381 Other malaise: Secondary | ICD-10-CM

## 2011-02-27 DIAGNOSIS — E785 Hyperlipidemia, unspecified: Secondary | ICD-10-CM

## 2011-02-27 DIAGNOSIS — R300 Dysuria: Secondary | ICD-10-CM

## 2011-02-27 DIAGNOSIS — R82998 Other abnormal findings in urine: Secondary | ICD-10-CM

## 2011-02-27 DIAGNOSIS — R5383 Other fatigue: Secondary | ICD-10-CM

## 2011-02-27 DIAGNOSIS — G4733 Obstructive sleep apnea (adult) (pediatric): Secondary | ICD-10-CM

## 2011-02-27 DIAGNOSIS — R7309 Other abnormal glucose: Secondary | ICD-10-CM

## 2011-02-27 LAB — CBC WITH DIFFERENTIAL/PLATELET
Basophils Absolute: 0 10*3/uL (ref 0.0–0.1)
Eosinophils Absolute: 0.2 10*3/uL (ref 0.0–0.7)
Lymphocytes Relative: 32.4 % (ref 12.0–46.0)
MCHC: 33.4 g/dL (ref 30.0–36.0)
Neutro Abs: 2.6 10*3/uL (ref 1.4–7.7)
Neutrophils Relative %: 52.1 % (ref 43.0–77.0)
Platelets: 233 10*3/uL (ref 150.0–400.0)
RDW: 13.9 % (ref 11.5–14.6)

## 2011-02-27 LAB — BASIC METABOLIC PANEL
CO2: 27 mEq/L (ref 19–32)
Calcium: 9.2 mg/dL (ref 8.4–10.5)
Creatinine, Ser: 0.9 mg/dL (ref 0.4–1.5)
Glucose, Bld: 94 mg/dL (ref 70–99)

## 2011-02-27 LAB — POCT URINALYSIS DIPSTICK
Ketones, UA: NEGATIVE
Protein, UA: NEGATIVE
Spec Grav, UA: 1.01
pH, UA: 7

## 2011-02-27 LAB — HEMOGLOBIN A1C: Hgb A1c MFr Bld: 5.6 % (ref 4.6–6.5)

## 2011-02-27 LAB — T4, FREE: Free T4: 0.8 ng/dL (ref 0.60–1.60)

## 2011-02-27 MED ORDER — RANITIDINE HCL 150 MG PO TABS: 150.0000 mg | ORAL_TABLET | Freq: Two times a day (BID) | ORAL | Status: DC

## 2011-02-27 NOTE — Patient Instructions (Signed)
The triggers for dyspepsia or "heart burn"  include stress; the "aspirin family" ; alcohol; peppermint; and caffeine (coffee, tea, cola, and chocolate). The aspirin family would include aspirin and the nonsteroidal agents such as ibuprofen &  Naproxen. Tylenol would not cause reflux. If having dyspepsia ; food & drink should be avoided for @ least 2 hours before going to bed.  Eat a low-fat diet with lots of fruits and vegetables, up to 7-9 servings per day. Avoid obesity; your goal is waist measurement < 40 inches.Consume less than 40 grams of sugar per day from foods & drinks with High Fructose Corn Sugar as #1,2,3 or # 4 on label. Follow the low carb nutrition program in [employer] as closely as possible to prevent Diabetes progression & complications. White carbohydrates (potatoes, rice, bread, and pasta) have a high spike of sugar and a high load of sugar. For example a  baked potato has a cup of sugar and a  french fry  2 teaspoons of sugar. Yams, wild  rice, whole grained bread &  wheat pasta have been much lower spike and load of  sugar. Portions should be the size of a deck of cards or your palm.

## 2011-02-27 NOTE — Progress Notes (Signed)
Subjective:    Patient ID: Bryan Barber, male    DOB: 06-12-1970, 40 y.o.   MRN: [id number]  HPI FATIGUE Onset: > 1 year   Fatigue @ rest : yes     Primarily motivational fatigue: no  Primarily physical fatigue: yes, "no energy to complete great plans" Symptoms: Fever/ chills : no  Night sweats: no                                                                                           Vision changes ( blurred/ double/ loss): no                                                                                                 Hoarseness or swallowing dysfunction: no                                                                                         Bowel changes( constipation/ diarrhea): some loose - watery stool                                                                                      Weight change: no but unable to lose weight   Exertional chest pain: no  Dyspnea on exertion: no ; asthma controlled except with RTIs Cough: no  New medications: yes, Wellbutrin added to help fatigue possibly related to Prozac  Leg swelling: no  Orthopnea: no  PND: no  Melena/ rectal bleeding: no  Adenopathy: no  Severe snoring: yes, if CPAP not used  Daytime sleepiness: yes Skin / hair / nail changes: no  Temperature intolerance( heat/ cold) : heat intolerance  Feeling depressed: no, not on meds from Dr Evelene Croon  Anhedonia: no  Altered appetite: no . No diet plan Poor sleep/ Apnea :quality good but now averaging 4-6 hrs with CPAP; pressure increased  Abnormal bruising / bleeding or enlarged lymph nodes: no                                                                         PMH/ FH of thyroid disease: ? mother    6 months ago labs were done as part of a job fair; fasting  cholesterol was mildly elevated as was glucose.    Review of Systems   He denies dysuria, hematuria, or pyuria.  Urinary frequency is related to the volume of fluid seen just. He questions an abnormal odor to the urine.  He describes occasional dysphagia with meals. He has developed some bursitis of the right knee related to his job; he is not taking NSAIDS     Objective:   Physical Exam  Gen.:  well-nourished; in no acute distress Eyes: Extraocular motion intact; no lid lag or proptosis Neck:  No deformities, thyromegaly, masses, or tenderness noted.   Supple with full range of motion without pain.  Heart: Normal rhythm and rate without significant murmur, gallop. S4 Lungs: Chest clear to auscultation without rales,rales, wheezes Bowel sounds are normal. Abdomen is soft and nontender with no organomegaly, hernias  or masses.  Neuro:Deep tendon reflexes are equal and within normal limits; no tremor  Skin: Warm and dry without significant lesions or rashes; no onycholysis  Musculoskeletal: No effusion of the right knee; range of motion is normal. There is mild crepitus of the left knee.  Psych: Normally communicative and interactive;  mood or affect slightly flat         Assessment & Plan:  #1 fatigue  #2 malodorous urine; urinalysis is normal  #3 sleep apnea  #4 dyslipidemia and fasting hyperglycemia by history  #5 bowel changes as noted, ? IBS  #6 dysphagia  Plan: See orders and recommendations

## 2011-05-08 ENCOUNTER — Encounter: Payer: Managed Care, Other (non HMO) | Admitting: Internal Medicine

## 2011-07-17 ENCOUNTER — Other Ambulatory Visit: Payer: Self-pay | Admitting: Internal Medicine

## 2011-07-17 ENCOUNTER — Telehealth: Payer: Self-pay

## 2011-07-17 DIAGNOSIS — T17320A Food in larynx causing asphyxiation, initial encounter: Secondary | ICD-10-CM

## 2011-07-17 NOTE — Telephone Encounter (Signed)
I recommend that he consider a GI referral rather than ENT as they are experts in this field.

## 2011-07-17 NOTE — Telephone Encounter (Signed)
Discuss with patient referral put in awaiting appt info. 

## 2011-07-17 NOTE — Telephone Encounter (Signed)
Messae left on voicemail: Patient would like referral to ENT due to choking when eating, this a concern that he has discussed with Dr.Hopper before.  Dr.Hopper please advise if you are in agreement with patient's request

## 2011-07-18 ENCOUNTER — Encounter: Payer: Self-pay | Admitting: Internal Medicine

## 2011-07-26 ENCOUNTER — Ambulatory Visit: Payer: Managed Care, Other (non HMO) | Admitting: Internal Medicine

## 2011-08-09 ENCOUNTER — Encounter: Payer: Self-pay | Admitting: *Deleted

## 2011-08-13 ENCOUNTER — Encounter: Payer: Self-pay | Admitting: Internal Medicine

## 2011-08-13 ENCOUNTER — Ambulatory Visit (INDEPENDENT_AMBULATORY_CARE_PROVIDER_SITE_OTHER): Payer: Managed Care, Other (non HMO) | Admitting: Internal Medicine

## 2011-08-13 VITALS — BP 94/62 | HR 96 | Ht 66.5 in | Wt 239.4 lb

## 2011-08-13 DIAGNOSIS — R1314 Dysphagia, pharyngoesophageal phase: Secondary | ICD-10-CM

## 2011-08-13 DIAGNOSIS — K219 Gastro-esophageal reflux disease without esophagitis: Secondary | ICD-10-CM | POA: Insufficient documentation

## 2011-08-13 DIAGNOSIS — R1310 Dysphagia, unspecified: Secondary | ICD-10-CM

## 2011-08-13 NOTE — Progress Notes (Signed)
Subjective:    Patient ID: Bryan Barber, male    DOB: 1970-12-21, 41 y.o.   MRN: [id number]  HPI This is a pleasant married 41 year old white man who has had a 3-4 year history of intermittent dysphagia to solid foods. Dr. Alwyn Ren quested evaluation.  The patient noted about 3 or 4 years ago rare intermittent choking and impacted dysphagia in the suprasternal area. Drinking water would help food go down. Steak, other meats were noted to be particular problems. Over time this is increased in frequency, to her happen several days a week now at times. He has had to go regurgitate food in the bathroom after it has lodged. He travels for work, is a Production manager for [employer], and eats fairly well at restaurants and often eat steak and has noted quite a bit then. He denies heartburn problems except for rare symptoms. He does have allergic asthma the thinks his allergies are well under control on medications.  GI review of systems is otherwise negative.  Allergies  Allergen Reactions  . Aspirin     Face swelling    Outpatient Prescriptions Prior to Visit  Medication Sig Dispense Refill  . albuterol (PROAIR HFA) 108 (90 BASE) MCG/ACT inhaler Inhale 2 puffs into the lungs every 6 (six) hours as needed.        Marland Kitchen FLUoxetine (PROZAC) 40 MG capsule Take 20 mg by mouth daily.       . Fluticasone-Salmeterol (ADVAIR DISKUS) 250-50 MCG/DOSE AEPB Inhale 1 puff into the lungs daily.       . modafinil (PROVIGIL) 200 MG tablet Take 400 mg by mouth daily.        . Mometasone Furo-Formoterol Fum 200-5 MCG/ACT AERO Inhale 2 puffs into the lungs daily.        . montelukast (SINGULAIR) 10 MG tablet Take 1 tablet (10 mg total) by mouth at bedtime.  90 tablet  2  . ranitidine (ZANTAC) 150 MG tablet Take 1 tablet (150 mg total) by mouth 2 (two) times daily.  60 tablet  2   Past Medical History  Diagnosis Date  . OSA on CPAP   . Asthma   . HLD (hyperlipidemia)   . Allergic rhinitis   . Obesity    Past Surgical History    Procedure Date  . Wrist surgery 10/2010    cyst removed. Right wrist  . Wisdom tooth extraction    History   Social History  . Marital Status: Married    Spouse Name: N/A    Number of Children: 1  . Years of Education: N/A   Occupational History  . BUYER  [employer]   Social History Main Topics  . Smoking status: Former Smoker -- 1.0 packs/day for 10 years    Types: Cigarettes    Quit date: 06/05/1995  . Smokeless tobacco: Never Used  . Alcohol Use: Yes     1-2 per day  . Drug Use: Yes    Special: Marijuana     in past younger years              Family History  Problem Relation Age of Onset  . Hypertension Father   . Sleep apnea Mother   . Colon cancer Neg Hx   . Heart disease Paternal Grandfather   . Pancreatic cancer Maternal Grandfather         Review of Systems Positive for recent upper respiratory infection All other abuses is negative or as per history of present illness.  Objective:   Physical Exam General:  Well-developed, well-nourished and in no acute distress Eyes:  anicteric. ENT:   Mouth and posterior pharynx free of lesions. There is a healing lower lip split that he relates to severe dry lips with his    recent URI. Neck:   supple w/o thyromegaly or mass.  Lungs: Clear to auscultation bilaterally. Heart:  S1S2, no rubs, murmurs, gallops. Abdomen:  soft, non-tender, no hepatosplenomegaly, hernia, or mass and BS+. Obese Lymph:  no cervical or supraclavicular adenopathy. Extremities:   no edema Skin   no rash. Neuro:  A&O x 3.  Psych:  appropriate mood and  Affect.        Assessment & Plan:   1. Esophageal dysphagia    3-4 year history of solid food dysphagia. Sounds most like a peptic stricture but with his allergic asthma history he is at higher risk of eosinophilic esophagitis. The next appropriate step is an investigation with upper GI endoscopy with plans for likely esophageal dilation. Have explained the risks benefits and  indications as well as alternative approaches and the patient understands and agrees to proceed. He may need a proton pump inhibitor and further lifestyle changes as well. Await findings of the endoscopy.

## 2011-08-13 NOTE — Patient Instructions (Signed)
You have been scheduled for an endoscopy.Please follow written instructions given to you at your visit today. Stay on a soft diet until your procedure, try to avoid rice, chicken, steak.  Cut food into small pieces.

## 2011-08-20 ENCOUNTER — Ambulatory Visit (AMBULATORY_SURGERY_CENTER): Payer: Managed Care, Other (non HMO) | Admitting: Internal Medicine

## 2011-08-20 ENCOUNTER — Encounter: Payer: Self-pay | Admitting: Internal Medicine

## 2011-08-20 VITALS — HR 97 | Temp 97.3°F | Resp 18 | Ht 66.0 in | Wt 239.0 lb

## 2011-08-20 DIAGNOSIS — K2980 Duodenitis without bleeding: Secondary | ICD-10-CM

## 2011-08-20 DIAGNOSIS — K317 Polyp of stomach and duodenum: Secondary | ICD-10-CM

## 2011-08-20 DIAGNOSIS — K2289 Other specified disease of esophagus: Secondary | ICD-10-CM

## 2011-08-20 DIAGNOSIS — K228 Other specified diseases of esophagus: Secondary | ICD-10-CM

## 2011-08-20 DIAGNOSIS — K200 Eosinophilic esophagitis: Secondary | ICD-10-CM

## 2011-08-20 DIAGNOSIS — R1314 Dysphagia, pharyngoesophageal phase: Secondary | ICD-10-CM

## 2011-08-20 DIAGNOSIS — D131 Benign neoplasm of stomach: Secondary | ICD-10-CM

## 2011-08-20 DIAGNOSIS — K2210 Ulcer of esophagus without bleeding: Secondary | ICD-10-CM

## 2011-08-20 DIAGNOSIS — K219 Gastro-esophageal reflux disease without esophagitis: Secondary | ICD-10-CM

## 2011-08-20 HISTORY — DX: Gastro-esophageal reflux disease without esophagitis: K21.9

## 2011-08-20 HISTORY — DX: Eosinophilic esophagitis: K20.0

## 2011-08-20 HISTORY — DX: Polyp of stomach and duodenum: K31.7

## 2011-08-20 HISTORY — PX: ESOPHAGOGASTRODUODENOSCOPY (EGD) WITH ESOPHAGEAL DILATION: SHX5812

## 2011-08-20 MED ORDER — PANTOPRAZOLE SODIUM 40 MG PO TBEC: 40.0000 mg | DELAYED_RELEASE_TABLET | Freq: Every day | ORAL | Status: DC

## 2011-08-20 MED ORDER — SODIUM CHLORIDE 0.9 % IV SOLN: 500.0000 mL | INTRAVENOUS | Status: DC

## 2011-08-20 NOTE — Patient Instructions (Signed)
YOU HAD AN ENDOSCOPIC PROCEDURE TODAY AT THE [facility]: Refer to the procedure report that was given to you for any specific questions about what was found during the examination.  If the procedure report does not answer your questions, please call your gastroenterologist to clarify.  If you requested that your care partner not be given the details of your procedure findings, then the procedure report has been included in a sealed envelope for you to review at your convenience later.  YOU SHOULD EXPECT: Some feelings of bloating in the abdomen. Passage of more gas than usual.  Walking can help get rid of the air that was put into your GI tract during the procedure and reduce the bloating. If you had a lower endoscopy (such as a colonoscopy or flexible sigmoidoscopy) you may notice spotting of blood in your stool or on the toilet paper. If you underwent a bowel prep for your procedure, then you may not have a normal bowel movement for a few days.  DIET: Your first meal following the procedure should be a light meal and then it is ok to progress to your normal diet.  A half-sandwich or bowl of soup is an example of a good first meal.  Heavy or fried foods are harder to digest and may make you feel nauseous or bloated.  Likewise meals heavy in dairy and vegetables can cause extra gas to form and this can also increase the bloating.  Drink plenty of fluids but you should avoid alcoholic beverages for 24 hours.  ACTIVITY: Your care partner should take you home directly after the procedure.  You should plan to take it easy, moving slowly for the rest of the day.  You can resume normal activity the day after the procedure however you should NOT DRIVE or use heavy machinery for 24 hours (because of the sedation medicines used during the test).    SYMPTOMS TO REPORT IMMEDIATELY: A gastroenterologist can be reached at any hour.  During normal business hours, 8:30 AM to 5:00 PM Monday through Friday,  call [phone].  After hours and on weekends, please call the GI answering service at [phone] who will take a message and have the physician on call contact you.  YOU HAD AN ENDOSCOPIC PROCEDURE TODAY AT THE [facility]: Refer to the procedure report that was given to you for any specific questions about what was found during the examination.  If the procedure report does not answer your questions, please call your gastroenterologist to clarify.  If you requested that your care partner not be given the details of your procedure findings, then the procedure report has been included in a sealed envelope for you to review at your convenience later.  YOU SHOULD EXPECT: Some feelings of bloating in the abdomen. Passage of more gas than usual.  Walking can help get rid of the air that was put into your GI tract during the procedure and reduce the bloating. If you had a lower endoscopy (such as a colonoscopy or flexible sigmoidoscopy) you may notice spotting of blood in your stool or on the toilet paper. If you underwent a bowel prep for your procedure, then you may not have a normal bowel movement for a few days.  DIET: Your first meal following the procedure should be a light meal and then it is ok to progress to your normal diet.  A half-sandwich or bowl of soup is an example of a good first meal.  Heavy  or fried foods are harder to digest and may make you feel nauseous or bloated.  Likewise meals heavy in dairy and vegetables can cause extra gas to form and this can also increase the bloating.  Drink plenty of fluids but you should avoid alcoholic beverages for 24 hours.  ACTIVITY: Your care partner should take you home directly after the procedure.  You should plan to take it easy, moving slowly for the rest of the day.  You can resume normal activity the day after the procedure however you should NOT DRIVE or use heavy machinery for 24 hours (because of the sedation medicines used  during the test).    SYMPTOMS TO REPORT IMMEDIATELY: A gastroenterologist can be reached at any hour.  During normal business hours, 8:30 AM to 5:00 PM Monday through Friday, call [phone].  After hours and on weekends, please call the GI answering service at [phone] who will take a message and have the physician on call contact you.  Following lower endoscopy (colonoscopy or flexible sigmoidoscopy):  Excessive amounts of blood in the stool  Significant tenderness or worsening of abdominal pains  Swelling of the abdomen that is new, acute  Fever of 100F or higher Following upper endoscopy (EGD)  Vomiting of blood or coffee ground material  New chest pain or pain under the shoulder blades  Painful or persistently difficult swallowing  New shortness of breath  Fever of 100F or higher  Black, tarry-looking stools  FOLLOW UP: If any biopsies were taken you will be contacted by phone or by letter within the next 1-3 weeks.  Call your gastroenterologist if you have not heard about the biopsies in 3 weeks.  Our staff will call the home number listed on your records the next business day following your procedure to check on you and address any questions or concerns that you may have at that time regarding the information given to you following your procedure. This is a courtesy call and so if there is no answer at the home number and we have not heard from you through the emergency physician on call, we will assume that you have returned to your regular daily activities without incident.  SIGNATURES/CONFIDENTIALITY:  You and/or your care partner have signed paperwork which will be entered into your electronic medical record.  These signatures attest to the fact that that the information above on your After Visit Summary has been reviewed and is understood.  Full responsibility of the confidentiality of this discharge information lies with you and/or your care-partner. Following upper  endoscopy (EGD)  Take your pantoprazole as ordered every day.  Eat a soft diet for the rest of tonight.  Vomiting of blood or coffee ground material  New chest pain or pain under the shoulder blades  Painful or persistently difficult swallowing  New shortness of breath  Fever of 100F or higher  Black, tarry-looking stools  FOLLOW UP: If any biopsies were taken you will be contacted by phone or by letter within the next 1-3 weeks.  Call your gastroenterologist if you have not heard about the biopsies in 3 weeks.  Our staff will call the home number listed on your records the next business day following your procedure to check on you and address any questions or concerns that you may have at that time regarding the information given to you following your procedure. This is a courtesy call and so if there is no answer at the home number and we have  not heard from you through the emergency physician on call, we will assume that you have returned to your regular daily activities without incident.  SIGNATURES/CONFIDENTIALITY: You and/or your care partner have signed paperwork which will be entered into your electronic medical record.  These signatures attest to the fact that that the information above on your After Visit Summary has been reviewed and is understood.  Full responsibility of the confidentiality of this discharge information lies with you and/or your care-partner.

## 2011-08-20 NOTE — Op Note (Signed)
[facility] [address] [place], [place]  [address]  ENDOSCOPY PROCEDURE REPORT  PATIENT:  Bryan, Barber  MR#:  [id number] BIRTHDATE:  09/22/1970, 40 yrs. old  GENDER:  male  ENDOSCOPIST:  Iva Boop, MD, [facility]  Referred by:  Marga Melnick, M.D.  PROCEDURE DATE:  08/20/2011 PROCEDURE:  EGD with biopsy, Maloney Dilation of the Esophagus ASA CLASS:  Class II INDICATIONS:  1) dysphagia  MEDICATIONS:   These medications were titrated to patient response per physician's verbal order, Fentanyl 75 mcg IV, Versed 6 mg IV TOPICAL ANESTHETIC:  Cetacaine Spray  DESCRIPTION OF PROCEDURE:   After the risks benefits and alternatives of the procedure were thoroughly explained, informed consent was obtained.  The LB-GIF H180 [id number] endoscope was introduced through the mouth and advanced to the second portion of the duodenum, without limitations.  The instrument was slowly withdrawn as the mucosa was carefully examined. <<PROCEDUREIMAGES>>  Multiple erosions were found in the distal esophagus. Two 2 cm erosions seen and biopsied.  Polyps in the body of the stomach. Several fleshy sessile polyps maximum size 5-6 mm - representative biopsies taken.  Duodenitis was found in the bulb of the duodenum. Edema, erythema and a few erosions.  The examination was otherwise normal.    Dilation was then performed at the total esophagus  1) Dilator:  Elease Hashimoto  Size(s):  54 French Resistance:  minimal  Heme:  yes  Small heme (post-biopsy also)  COMPLICATIONS:  None  ENDOSCOPIC IMPRESSION: 1) Erosions, multiple in the distal esophagus 2) Polyps in the body of the stomach 3) Duodenitis in the bulb of duodenum 4) Otherwise normal examination. - Dilated 54 French due to dysphagia RECOMMENDATIONS: 1) Post-dilation diet today 2) Start pantoprazole 40 mg every morning before breakfast 3) GERD handout 4) Follow-up plans to be decided after pathology review  Iva Boop, MD,  [facility]  CC:  Pecola Lawless, MD and The Patient  n. eSIGNED:   Iva Boop at 08/20/2011 05:29 PM  Harle Battiest, [phone]

## 2011-08-20 NOTE — Progress Notes (Signed)
Patient did not have preoperative order for IV antibiotic SSI prophylaxis. (G8918)  Patient did not experience any of the following events: a burn prior to discharge; a fall within the facility; wrong site/side/patient/procedure/implant event; or a hospital transfer or hospital admission upon discharge from the facility. (G8907)  

## 2011-08-21 ENCOUNTER — Telehealth: Payer: Self-pay

## 2011-08-21 NOTE — Telephone Encounter (Signed)
Left message on answering machine. 

## 2011-08-28 ENCOUNTER — Encounter: Payer: Self-pay | Admitting: Internal Medicine

## 2011-08-28 DIAGNOSIS — K200 Eosinophilic esophagitis: Secondary | ICD-10-CM | POA: Insufficient documentation

## 2011-10-08 ENCOUNTER — Ambulatory Visit (INDEPENDENT_AMBULATORY_CARE_PROVIDER_SITE_OTHER): Payer: Managed Care, Other (non HMO) | Admitting: Family Medicine

## 2011-10-08 ENCOUNTER — Encounter: Payer: Self-pay | Admitting: Family Medicine

## 2011-10-08 VITALS — BP 124/79 | HR 98 | Temp 97.8°F | Ht 68.0 in | Wt 236.2 lb

## 2011-10-08 DIAGNOSIS — H6690 Otitis media, unspecified, unspecified ear: Secondary | ICD-10-CM

## 2011-10-08 DIAGNOSIS — J309 Allergic rhinitis, unspecified: Secondary | ICD-10-CM

## 2011-10-08 DIAGNOSIS — J302 Other seasonal allergic rhinitis: Secondary | ICD-10-CM | POA: Insufficient documentation

## 2011-10-08 MED ORDER — AMOXICILLIN 875 MG PO TABS: 875.0000 mg | ORAL_TABLET | Freq: Two times a day (BID) | ORAL | Status: AC

## 2011-10-08 MED ORDER — BENZONATATE 200 MG PO CAPS: 200.0000 mg | ORAL_CAPSULE | Freq: Three times a day (TID) | ORAL | Status: AC | PRN

## 2011-10-08 NOTE — Patient Instructions (Signed)
You have a L ear infection Start the Amoxicillin twice daily w/ food Use the cough pills as needed Add Mucinex to thin your congestion Restart an allergy med- claritin or Zyrtec (store brand is fine) Drink plenty of fluids REST! Hang in there!!!

## 2011-10-08 NOTE — Progress Notes (Signed)
  Subjective:    Patient ID: Bryan Barber, male    DOB: 03/25/1971, 41 y.o.   MRN: [id number]  HPI URI- sxs started Thursday w/ sneezing and sore throat, nasal congestion.  Saturday developed HA, facial pain, fatigue.  Some ear fullness.  Tm 99.1.  Cough is productive but dry during the day.  No known sick contacts.  Not currently on allergy meds.   Review of Systems For ROS see HPI     Objective:   Physical Exam  Vitals reviewed. Constitutional: He appears well-developed and well-nourished. No distress.  HENT:  Head: Normocephalic and atraumatic.       No TTP over sinuses + turbinate edema + PND L TM erythematous, dull, poor landmarks R TM WNL  Eyes: Conjunctivae and EOM are normal. Pupils are equal, round, and reactive to light.  Neck: Normal range of motion. Neck supple.  Cardiovascular: Normal rate, regular rhythm and normal heart sounds.   Pulmonary/Chest: Effort normal and breath sounds normal. No respiratory distress. He has no wheezes.       Wet cough  Lymphadenopathy:    He has no cervical adenopathy.  Skin: Skin is warm and dry.          Assessment & Plan:

## 2011-10-08 NOTE — Assessment & Plan Note (Signed)
New.  Start amox.  Reviewed supportive care and red flags that should prompt return.  Pt expressed understanding and is in agreement w/ plan.  

## 2011-10-08 NOTE — Assessment & Plan Note (Signed)
New.  Start OTC antihistamine to improve nasal congestion, PND.  Reviewed supportive care and red flags that should prompt return.  Pt expressed understanding and is in agreement w/ plan.

## 2011-10-09 ENCOUNTER — Telehealth: Payer: Self-pay | Admitting: Family Medicine

## 2011-10-09 MED ORDER — GUAIFENESIN-CODEINE 100-10 MG/5ML PO SYRP: 5.0000 mL | ORAL_SOLUTION | Freq: Three times a day (TID) | ORAL | Status: AC | PRN

## 2011-10-09 NOTE — Telephone Encounter (Signed)
Called [facility] to [facility] [address], pt stated to [facility] the cough [facility] [address], gave pt the phone number to the [facility] to ask if they will split the formula in two bottles for travel.faxed manually

## 2011-10-09 NOTE — Telephone Encounter (Signed)
Ok for Cheratussin 10ml TID PRN, #240, no refills

## 2011-10-09 NOTE — Telephone Encounter (Signed)
t is calling to let RN know he was seen at the office yesterday for sinus infection. Pt was prescribed Tessalon TID which is not helping his cough at all. He is calling back to see if MD will order something else for him for the cough. He will be at work [employer] until [employer] and uses [employer].

## 2011-10-22 ENCOUNTER — Ambulatory Visit (INDEPENDENT_AMBULATORY_CARE_PROVIDER_SITE_OTHER): Payer: Managed Care, Other (non HMO) | Admitting: Internal Medicine

## 2011-10-22 VITALS — BP 120/83 | HR 94 | Temp 97.7°F | Resp 16 | Ht 68.0 in | Wt 237.0 lb

## 2011-10-22 DIAGNOSIS — J329 Chronic sinusitis, unspecified: Secondary | ICD-10-CM

## 2011-10-22 DIAGNOSIS — R05 Cough: Secondary | ICD-10-CM

## 2011-10-22 DIAGNOSIS — J301 Allergic rhinitis due to pollen: Secondary | ICD-10-CM

## 2011-10-22 DIAGNOSIS — Z6836 Body mass index (BMI) 36.0-36.9, adult: Secondary | ICD-10-CM | POA: Insufficient documentation

## 2011-10-22 DIAGNOSIS — J9801 Acute bronchospasm: Secondary | ICD-10-CM

## 2011-10-22 MED ORDER — HYDROCODONE-HOMATROPINE 5-1.5 MG/5ML PO SYRP: 5.0000 mL | ORAL_SOLUTION | Freq: Three times a day (TID) | ORAL | Status: DC | PRN

## 2011-10-22 MED ORDER — PREDNISONE 20 MG PO TABS: ORAL_TABLET | ORAL | Status: DC

## 2011-10-22 MED ORDER — AMOXICILLIN-POT CLAVULANATE 875-125 MG PO TABS: 1.0000 | ORAL_TABLET | Freq: Two times a day (BID) | ORAL | Status: AC

## 2011-10-22 NOTE — Progress Notes (Signed)
  Subjective:    Patient ID: Bryan Barber, male    DOB: March 07, 1971, 41 y.o.   MRN: [id number]  [facility] a three-week history of sinus congestion with pressure pain and ear pressure with pain at times. He also has a recurrent cough and recurrent problems with reactive airway disease. He was treated 2 weeks ago with amoxicillin for an otitis but has not gotten any better. Although his ear no longer hurts his cough is worse accompanied by fatigue and shortness of breath.The cough is only productive in the morning. It affects his sleep at night adversely. He travels by air every week. He is no longer on any inhalers although he has been in the past. He has not resumed smoking  Primary care Dr. Alwyn Ren, [place]and he's been unable to get in because of his work travel Patient Active Problem List  Diagnoses  . HYPERLIPIDEMIA--BMI 36!  . OBSESSIVE-COMPULSIVE DISORDER  . OBSTRUCTIVE SLEEP APNEA-Better on CPAP/Needs Provigil in am  . ASTHMA  . ASTHMA NOS W/ACUTE EXACERBATION  . Inadequate sleep hygiene  .  GERD - erosive esophagitis-Recent diagnosis by endoscopy-Dr. Leone Payor Has been on Protonix almost one month/never had traditional reflux symptoms/  . Eosinophilic esophagitis  . OM (otitis media)  . Seasonal allergic rhinitis-His allergies have been worse the spring      Review of Systems  Constitutional: Negative for fever, chills and unexpected weight change.  HENT: Negative for hearing loss and tinnitus.   Respiratory: Positive for choking.        Choking is only after certain meals which seemed to make his throat tight and increase the mucus in his throat  Cardiovascular: Negative for chest pain, palpitations and leg swelling.       Objective:   Physical Exam Elevated BMI Conjunctivae injected Tympanic membranes clear Nares boggy With purulent discharge/tender maxillary areas to percussion bilaterally Throat is shallow with large tonsils but no exudate or injection No a.c.  Nodes Chest with wheezing on forced expiration but no rales or rhonchi He volunteered that he should lose weight       Assessment & Plan:   1. Acute bronchospasm  predniSONE (DELTASONE) 20 MG tablet  2. Sinusitis  amoxicillin-clavulanate (AUGMENTIN) 875-125 MG per tablet  3. Cough  HYDROcodone-homatropine (HYCODAN) 5-1.5 MG/5ML syrup  4. Allergic rhinitis due to pollen    5. BMI 36.0-36.9,adult     He has albuterol to use when necessary Over-the-counter antihistamine He has a history of Afrin abuse but it would be okay to use this before a flight in any 12 hr period He was reluctant to use intranasal steroids because it has affected his taste in the past

## 2011-10-23 ENCOUNTER — Ambulatory Visit: Payer: Managed Care, Other (non HMO) | Admitting: Family Medicine

## 2011-10-31 ENCOUNTER — Telehealth: Payer: Self-pay | Admitting: Family Medicine

## 2011-10-31 DIAGNOSIS — R05 Cough: Secondary | ICD-10-CM

## 2011-10-31 MED ORDER — HYDROCODONE-HOMATROPINE 5-1.5 MG/5ML PO SYRP: 5.0000 mL | ORAL_SOLUTION | Freq: Three times a day (TID) | ORAL | Status: AC | PRN

## 2011-10-31 NOTE — Telephone Encounter (Signed)
Spoke with patient and let him know that he can pick refill up at front desk.  Informed him that if he needs another refill, he will have to be reevaluated.

## 2011-10-31 NOTE — Telephone Encounter (Signed)
Patient called stating that the pharmacy has sent over 2 requests to refill his Hycodan and have not heard back.  He is going OOT tomorrow and would like this refilled today.  Can we?

## 2011-10-31 NOTE — Telephone Encounter (Signed)
Authorized RF (printed rx), but if he needs more, he'll need a re-evaluation.

## 2011-11-27 ENCOUNTER — Other Ambulatory Visit: Payer: Self-pay | Admitting: Internal Medicine

## 2011-11-27 MED ORDER — ALBUTEROL SULFATE HFA 108 (90 BASE) MCG/ACT IN AERS: 2.0000 | INHALATION_SPRAY | Freq: Four times a day (QID) | RESPIRATORY_TRACT | Status: DC | PRN

## 2011-11-27 NOTE — Telephone Encounter (Signed)
RX sent

## 2011-11-27 NOTE — Telephone Encounter (Signed)
refill proair hfa hfa aer ad #90 mcg, no directions or last fill listed  Last ov 5.6.13

## 2011-11-28 ENCOUNTER — Other Ambulatory Visit: Payer: Self-pay | Admitting: *Deleted

## 2011-11-28 MED ORDER — PANTOPRAZOLE SODIUM 40 MG PO TBEC: 40.0000 mg | DELAYED_RELEASE_TABLET | Freq: Every day | ORAL | Status: DC

## 2011-11-29 ENCOUNTER — Other Ambulatory Visit: Payer: Self-pay

## 2011-11-29 MED ORDER — PANTOPRAZOLE SODIUM 40 MG PO TBEC: 40.0000 mg | DELAYED_RELEASE_TABLET | Freq: Every day | ORAL | Status: DC

## 2012-03-10 ENCOUNTER — Encounter: Payer: Self-pay | Admitting: Internal Medicine

## 2012-03-10 ENCOUNTER — Ambulatory Visit (INDEPENDENT_AMBULATORY_CARE_PROVIDER_SITE_OTHER): Payer: Managed Care, Other (non HMO) | Admitting: Internal Medicine

## 2012-03-10 VITALS — BP 132/90 | HR 99 | Temp 97.6°F | Wt 242.8 lb

## 2012-03-10 DIAGNOSIS — R5381 Other malaise: Secondary | ICD-10-CM

## 2012-03-10 DIAGNOSIS — R5383 Other fatigue: Secondary | ICD-10-CM

## 2012-03-10 DIAGNOSIS — E8881 Metabolic syndrome: Secondary | ICD-10-CM

## 2012-03-10 DIAGNOSIS — Z23 Encounter for immunization: Secondary | ICD-10-CM

## 2012-03-10 DIAGNOSIS — K429 Umbilical hernia without obstruction or gangrene: Secondary | ICD-10-CM

## 2012-03-10 LAB — CBC WITH DIFFERENTIAL/PLATELET
Basophils Absolute: 0 10*3/uL (ref 0.0–0.1)
Eosinophils Relative: 1.5 % (ref 0.0–5.0)
Monocytes Relative: 11.5 % (ref 3.0–12.0)
Neutrophils Relative %: 59.7 % (ref 43.0–77.0)
Platelets: 275 10*3/uL (ref 150.0–400.0)
RDW: 13.8 % (ref 11.5–14.6)
WBC: 5.5 10*3/uL (ref 4.5–10.5)

## 2012-03-10 LAB — TSH: TSH: 2.26 u[IU]/mL (ref 0.35–5.50)

## 2012-03-10 LAB — HEMOGLOBIN A1C: Hgb A1c MFr Bld: 5.8 % (ref 4.6–6.5)

## 2012-03-10 MED ORDER — MOMETASONE FURO-FORMOTEROL FUM 200-5 MCG/ACT IN AERO: 2.0000 | INHALATION_SPRAY | Freq: Every day | RESPIRATORY_TRACT | Status: DC

## 2012-03-10 NOTE — Progress Notes (Signed)
  Subjective:    Patient ID: Bryan Barber, male    DOB: 1970-10-08, 41 y.o.   MRN: [id number]  HPI He has noted a "squishy" sensation w/o pain at the umbilicus over the last 3-4 weeks. He's also had intermittent watery stools up to one time per week & increased 'gas". He does feel warm and sweats frequently.He describes fatigue  There is a protrusion at the umbilicus which is reducible; it is worse with coughing and improves when he is in the supine position.    Review of Systems He denies fever or chills. He has no unexplained weight loss, abdominal pain, melena, or rectal bleeding.     Objective:   Physical Exam General appearance: well nourished ; w/o distress.  Eyes: No conjunctival inflammation or scleral icterus is present.  Oral exam: Dental hygiene is good; lips and gums are healthy appearing.There is no oropharyngeal erythema or exudate noted. Large tonsils  Heart:  Normal rate and regular rhythm. S1 and S2 normal without gallop, murmur, click, rub .S4  Lungs:Chest clear to auscultation; no wheezes, rhonchi,rales ,or rubs present.No increased work of breathing.   Abdomen:Protuberant; bowel sounds normal, soft and non-tender without masses,or organomegaly .Small reducible umbilical hernia noted.  No guarding or rebound   Skin:Warm & dry.  Intact without suspicious lesions or rashes ; no jaundice or tenting  Lymphatic: No lymphadenopathy is noted about the head, neck, axilla areas.              Assessment & Plan:  #1 umbilical hernia #2 fatigue Plan: See orders and recommendations

## 2012-03-10 NOTE — Patient Instructions (Addendum)
Eat a low-fat diet with lots of fruits and vegetables, up to 7-9 servings per day. Consume less than 40 (preferably ZERO) grams of sugar per day from foods & drinks with High Fructose Corn Syrup (HFCS) sugar as #1,2,3 or # 4 on label.Whole Foods, [employer] Jo[employer] & [employer] do not carry products with HFCS. Follow a  low carb nutrition program such as [place] or The New Sugar Busters  to prevent Diabetes . White carbohydrates (potatoes, rice, bread, and pasta) have a high spike of sugar and a high load of sugar. For example a  baked potato has a cup of sugar and a  french fry  2 teaspoons of sugar. Yams, wild  rice, whole grained bread &  wheat pasta have been much lower spike and load of  sugar. Portions should be the size of a deck of cards or your palm.  

## 2012-09-08 ENCOUNTER — Encounter: Payer: Self-pay | Admitting: Family Medicine

## 2012-09-08 ENCOUNTER — Ambulatory Visit (INDEPENDENT_AMBULATORY_CARE_PROVIDER_SITE_OTHER): Payer: Managed Care, Other (non HMO) | Admitting: Family Medicine

## 2012-09-08 ENCOUNTER — Other Ambulatory Visit: Payer: Self-pay

## 2012-09-08 VITALS — BP 118/86 | HR 104 | Temp 98.7°F | Wt 243.2 lb

## 2012-09-08 DIAGNOSIS — J209 Acute bronchitis, unspecified: Secondary | ICD-10-CM

## 2012-09-08 DIAGNOSIS — J45901 Unspecified asthma with (acute) exacerbation: Secondary | ICD-10-CM

## 2012-09-08 MED ORDER — MOMETASONE FURO-FORMOTEROL FUM 200-5 MCG/ACT IN AERO: 2.0000 | INHALATION_SPRAY | Freq: Every day | RESPIRATORY_TRACT | Status: DC

## 2012-09-08 MED ORDER — IPRATROPIUM BROMIDE 0.02 % IN SOLN
0.5000 mg | Freq: Once | RESPIRATORY_TRACT | Status: AC
  Administered 2012-09-08: 0.5 mg via RESPIRATORY_TRACT

## 2012-09-08 MED ORDER — PREDNISONE 10 MG PO TABS: ORAL_TABLET | ORAL | Status: DC

## 2012-09-08 MED ORDER — ALBUTEROL SULFATE HFA 108 (90 BASE) MCG/ACT IN AERS: 2.0000 | INHALATION_SPRAY | Freq: Four times a day (QID) | RESPIRATORY_TRACT | Status: DC | PRN

## 2012-09-08 MED ORDER — ALBUTEROL SULFATE (5 MG/ML) 0.5% IN NEBU
2.5000 mg | INHALATION_SOLUTION | Freq: Once | RESPIRATORY_TRACT | Status: AC
Start: 2012-09-08 — End: 2012-09-08
  Administered 2012-09-08: 2.5 mg via RESPIRATORY_TRACT

## 2012-09-08 MED ORDER — METHYLPREDNISOLONE ACETATE 80 MG/ML IJ SUSP
80.0000 mg | Freq: Once | INTRAMUSCULAR | Status: AC
  Administered 2012-09-08: 80 mg via INTRAMUSCULAR

## 2012-09-08 MED ORDER — GUAIFENESIN-CODEINE 100-10 MG/5ML PO SYRP: ORAL_SOLUTION | ORAL | Status: DC

## 2012-09-08 MED ORDER — AZITHROMYCIN 250 MG PO TABS: ORAL_TABLET | ORAL | Status: DC

## 2012-09-08 NOTE — Patient Instructions (Signed)

## 2012-09-08 NOTE — Telephone Encounter (Signed)
Spoke with patient and he is requesting a refill on his Protonix. Please advise     KP

## 2012-09-08 NOTE — Telephone Encounter (Signed)
Patient advised that Dr Leone Payor is glad to fill his Protonix x 2 months, but he needs office visit before he runs out of the medication. Patient verbalizes understanding and states that he will call our office back to schedule as he is driving right now.

## 2012-09-08 NOTE — Telephone Encounter (Signed)
Ok to refill for 2 months Should have a follow-up with me before it runs out

## 2012-09-08 NOTE — Progress Notes (Signed)
  Subjective:     Bryan Barber is a 42 y.o. male who presents for evaluation of symptoms of a URI. Symptoms include congestion, cough described as productive, no  fever, sinus pressure, sneezing and wheezing. Onset of symptoms was 4 days ago, and has been gradually worsening since that time. Treatment to date: antihistamines and cough suppressants.  The following portions of the patient's history were reviewed and updated as appropriate: allergies, current medications, past family history, past medical history, past social history, past surgical history and problem list.  Review of Systems Pertinent items are noted in HPI.   Objective:    BP 118/86  Pulse 104  Temp(Src) 98.7 F (37.1 C) (Oral)  Wt 243 lb 3.2 oz (110.315 kg)  BMI 36.99 kg/m2  SpO2 98% General appearance: alert, cooperative, appears stated age and no distress Ears: normal TM's and external ear canals both ears Nose: yellow discharge, mild congestion, turbinates red, swollen, no sinus tenderness Throat: lips, mucosa, and tongue normal; teeth and gums normal Neck: moderate anterior cervical adenopathy, supple, symmetrical, trachea midline and thyroid not enlarged, symmetric, no tenderness/mass/nodules Lungs: rhonchi bilaterally and wheezes bilaterally Heart: S1, S2 normal   Assessment:    asthma and bronchitis   Plan:    Nasal saline spray for congestion. Augmentin per orders. Nasal steroids per orders. Follow up as needed.  depomedrol and pred taper

## 2012-09-12 ENCOUNTER — Other Ambulatory Visit: Payer: Self-pay | Admitting: *Deleted

## 2012-09-12 ENCOUNTER — Telehealth: Payer: Self-pay | Admitting: *Deleted

## 2012-09-12 DIAGNOSIS — R05 Cough: Secondary | ICD-10-CM

## 2012-09-12 MED ORDER — HYDROCOD POLST-CHLORPHEN POLST 10-8 MG/5ML PO LQCR: 5.0000 mL | Freq: Every evening | ORAL | Status: DC | PRN

## 2012-09-12 NOTE — Telephone Encounter (Signed)
Left message on voicemail informing patient of Dr.Lowne's response, Dois Davenport handled rx

## 2012-09-12 NOTE — Telephone Encounter (Signed)
[facility]. Pt notified to f/u with Dr. Alwyn Ren if no better

## 2012-09-12 NOTE — Telephone Encounter (Signed)
[facility]/ [facility] [address]

## 2012-09-12 NOTE — Telephone Encounter (Signed)
tussionex 6 oz 1 tsp po qhs prn cough---- will need ov with hopp if no better

## 2012-09-12 NOTE — Telephone Encounter (Signed)
Patient left message on triage stating Robitussin AC is not working for his cough and would like something different called in.

## 2012-09-12 NOTE — Telephone Encounter (Signed)
Patient seen Dr.Lowne for this concern, will forward to her to address

## 2013-04-09 ENCOUNTER — Other Ambulatory Visit: Payer: Self-pay

## 2013-09-21 ENCOUNTER — Ambulatory Visit (INDEPENDENT_AMBULATORY_CARE_PROVIDER_SITE_OTHER): Payer: Managed Care, Other (non HMO) | Admitting: Internal Medicine

## 2013-09-21 ENCOUNTER — Encounter: Payer: Self-pay | Admitting: Internal Medicine

## 2013-09-21 VITALS — BP 128/84 | HR 107 | Ht 68.0 in | Wt 254.2 lb

## 2013-09-21 DIAGNOSIS — J309 Allergic rhinitis, unspecified: Secondary | ICD-10-CM

## 2013-09-21 DIAGNOSIS — K219 Gastro-esophageal reflux disease without esophagitis: Secondary | ICD-10-CM

## 2013-09-21 DIAGNOSIS — IMO0001 Reserved for inherently not codable concepts without codable children: Secondary | ICD-10-CM | POA: Insufficient documentation

## 2013-09-21 DIAGNOSIS — K200 Eosinophilic esophagitis: Secondary | ICD-10-CM

## 2013-09-21 DIAGNOSIS — E669 Obesity, unspecified: Secondary | ICD-10-CM

## 2013-09-21 MED ORDER — PANTOPRAZOLE SODIUM 40 MG PO TBEC: 40.0000 mg | DELAYED_RELEASE_TABLET | Freq: Every day | ORAL | Status: DC

## 2013-09-21 MED ORDER — FLUTICASONE PROPIONATE 50 MCG/ACT NA SUSP: 2.0000 | Freq: Every day | NASAL | Status: DC

## 2013-09-21 NOTE — Assessment & Plan Note (Signed)
Resume PPI Monitor RTC 1 year

## 2013-09-21 NOTE — Patient Instructions (Addendum)
We have sent the following medications to your pharmacy for you to pick up at your convenience: Prednisone, Flonase, Protonix    You will be contacted by [facility] about your appointment for a consult about bariatric surgery.   Follow up in a year with [place].    I appreciate the opportunity to care for you.

## 2013-09-21 NOTE — Assessment & Plan Note (Signed)
Resume PPI 

## 2013-09-21 NOTE — Progress Notes (Signed)
         Subjective:    Patient ID: Bryan Barber, male    DOB: 12-28-1970, 43 y.o.   MRN: [id number]  HPI 87o yo wm w/ GERD and eosinophilic esophagitis not seen since EGD/dili in 2013. Has stopped (run out) of PPI and did not f/u after EGD as requested. Wife fas been ill - having neurosurgery at [facility].  Rare dysphagia now. No heartburn. Did not understand that PPI needed as he did not have heartburn  Also having some globus-lie sensation and pressure in right jaw-line to ear.  Concerned about weight - has dieted w/o good results - interested in possible bariatric options  Has seen allergy tears ago - not interested in seeing again at this time  Medications, allergies, past medical history, past surgical history, family history and social history are reviewed and updated in the EMR.   Review of Systems As above    Objective:   Physical Exam Obese NAD Neck supple w/o thyromegaly or mass, LA Pharynx clear    Assessment & Plan:  Eosinophilic esophagitis   GERD - erosive esophagitis  Allergic rhinitis  Obesity, Class II, BMI 35-39.9, with comorbidity- OSA  1. Restart pantoprazole 40 mg qd 2. Flonase nasal spray 3. Refer to Dr. Redmond Pulling re: consider as a bariatric surgery candidate - he has BMI 35-39.9 with OSA so is a potential surgical candidate 4. RTC routinely 1 year, sooner prn

## 2013-09-22 ENCOUNTER — Telehealth: Payer: Self-pay

## 2013-09-22 NOTE — Telephone Encounter (Signed)
Message copied by [place], Mallorie Norrod E on Tue Sep 22, 2013  9:06 AM ------      Message from: [facility]      Created: Tue Sep 22, 2013  7:02 AM       Jaquelin Meaney,The 1st step in this process is the pt calling Elvina Sidle at [phone] and signing up for the bariatric seminar.At that meeting the coordinator will answer all questions and give the next steps to the process.Marland KitchenMarland KitchenThanks janice      ----- Message -----         From: Katara Griner E [place], CMA         Sent: 09/21/2013   4:32 PM           To: [facility]            Please refer pt to Dr. Redmond Pulling for bariatric surgery.        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            Thank you!!!        ------

## 2013-09-22 NOTE — Telephone Encounter (Signed)
Spoke with patient and informed him of the information from [facility] , he requested that I call back and give him the #.  He was unable to write it down.

## 2013-09-22 NOTE — Telephone Encounter (Signed)
Left the [facility] # that patient needs to call on his voice mail, he had said that would be fine to do.

## 2013-10-31 ENCOUNTER — Other Ambulatory Visit: Payer: Self-pay | Admitting: Internal Medicine

## 2014-08-11 ENCOUNTER — Encounter: Payer: Self-pay | Admitting: Internal Medicine

## 2014-08-11 ENCOUNTER — Other Ambulatory Visit (INDEPENDENT_AMBULATORY_CARE_PROVIDER_SITE_OTHER): Payer: Managed Care, Other (non HMO)

## 2014-08-11 ENCOUNTER — Ambulatory Visit (INDEPENDENT_AMBULATORY_CARE_PROVIDER_SITE_OTHER): Payer: Managed Care, Other (non HMO) | Admitting: Internal Medicine

## 2014-08-11 VITALS — BP 132/98 | HR 98 | Temp 98.1°F | Resp 15 | Ht 68.0 in | Wt 252.1 lb

## 2014-08-11 DIAGNOSIS — K219 Gastro-esophageal reflux disease without esophagitis: Secondary | ICD-10-CM

## 2014-08-11 DIAGNOSIS — J4520 Mild intermittent asthma, uncomplicated: Secondary | ICD-10-CM

## 2014-08-11 DIAGNOSIS — R5383 Other fatigue: Secondary | ICD-10-CM

## 2014-08-11 DIAGNOSIS — E785 Hyperlipidemia, unspecified: Secondary | ICD-10-CM

## 2014-08-11 LAB — HEPATIC FUNCTION PANEL
ALT: 29 U/L (ref 0–53)
AST: 18 U/L (ref 0–37)
Albumin: 4.2 g/dL (ref 3.5–5.2)
Alkaline Phosphatase: 89 U/L (ref 39–117)
BILIRUBIN DIRECT: 0 mg/dL (ref 0.0–0.3)
Total Bilirubin: 0.4 mg/dL (ref 0.2–1.2)
Total Protein: 7.2 g/dL (ref 6.0–8.3)

## 2014-08-11 LAB — CBC WITH DIFFERENTIAL/PLATELET
Basophils Absolute: 0 10*3/uL (ref 0.0–0.1)
Basophils Relative: 0.7 % (ref 0.0–3.0)
Eosinophils Absolute: 0.2 10*3/uL (ref 0.0–0.7)
Eosinophils Relative: 2.7 % (ref 0.0–5.0)
HCT: 44.2 % (ref 39.0–52.0)
Hemoglobin: 15.3 g/dL (ref 13.0–17.0)
LYMPHS PCT: 25.6 % (ref 12.0–46.0)
Lymphs Abs: 1.6 10*3/uL (ref 0.7–4.0)
MCHC: 34.5 g/dL (ref 30.0–36.0)
MCV: 86.9 fl (ref 78.0–100.0)
MONO ABS: 0.7 10*3/uL (ref 0.1–1.0)
Monocytes Relative: 12.1 % — ABNORMAL HIGH (ref 3.0–12.0)
NEUTROS PCT: 58.9 % (ref 43.0–77.0)
Neutro Abs: 3.6 10*3/uL (ref 1.4–7.7)
Platelets: 253 10*3/uL (ref 150.0–400.0)
RBC: 5.09 Mil/uL (ref 4.22–5.81)
RDW: 14 % (ref 11.5–15.5)
WBC: 6.1 10*3/uL (ref 4.0–10.5)

## 2014-08-11 LAB — BASIC METABOLIC PANEL
BUN: 8 mg/dL (ref 6–23)
CO2: 27 mEq/L (ref 19–32)
Calcium: 9.3 mg/dL (ref 8.4–10.5)
Chloride: 104 mEq/L (ref 96–112)
Creatinine, Ser: 0.93 mg/dL (ref 0.40–1.50)
GFR: 95.01 mL/min (ref >60.00)
Glucose, Bld: 99 mg/dL (ref 70–99)
Potassium: 3.8 mEq/L (ref 3.5–5.1)
Sodium: 137 mEq/L (ref 135–145)

## 2014-08-11 LAB — TSH: TSH: 1.57 u[IU]/mL (ref 0.35–4.50)

## 2014-08-11 LAB — T4, FREE: Free T4: 0.83 ng/dL (ref 0.60–1.60)

## 2014-08-11 MED ORDER — MOMETASONE FURO-FORMOTEROL FUM 200-5 MCG/ACT IN AERO: INHALATION_SPRAY | RESPIRATORY_TRACT | Status: DC

## 2014-08-11 NOTE — Progress Notes (Signed)
Pre visit review using our clinic review tool, if applicable. No additional management support is needed unless otherwise documented below in the visit note. 

## 2014-08-11 NOTE — Progress Notes (Signed)
   Subjective:    Patient ID: Bryan Barber, male    DOB: [birth year], 44 y.o.   MRN: [id number]  HPI The patient is here to assess status of active health conditions. PMH, FH, & Social History reviewed & updated.  He is not on heart healthy diet; lipids monitored at work. Walking extensively on job w/o cardiopulmonary symptoms.  He is using rescue inhaler 1-2X/month.Asthma triggers are stress and exercise. Dulera employed daily as maintenance prevention.  Sleeping 6 hours with CPAP; but he has significant fatigue.  He questions intervention for his umbilical hernia. PMH of esophageal dilation.   Review of Systems Extrinsic symptoms of itchy, watery eyes, sneezing, or angioedema are denied. There is no significant cough, sputum production, wheezing,or  paroxysmal nocturnal dyspnea.  Unexplained weight loss, abdominal pain, significant dyspepsia, dysphagia, melena, rectal bleeding, or persistently small caliber stools are denied.  Chest pain, palpitations, tachycardia, exertional dyspnea, paroxysmal nocturnal dyspnea, claudication or edema are absent.         Objective:   Physical Exam  Gen.: Adequately nourished in appearance. Alert, appropriate and cooperative throughout exam. BMI: 38.34 Appears younger than stated age  Head: Normocephalic without obvious abnormalities; no alopecia  Eyes: No corneal or conjunctival inflammation noted. Pupils equal round reactive to light and accommodation. Extraocular motion intact.  Ears: External  ear exam reveals no significant lesions or deformities. Canals clear .TMs normal. Hearing is grossly normal bilaterally. Nose: External nasal exam reveals no deformity or inflammation. Nasal mucosa are slightly erythematous. No lesions or exudates noted.   Mouth: Oral mucosa and oropharynx reveal no lesions or exudates. Crowding of oropharynx. Teeth in good repair. Neck: No deformities, masses, or tenderness noted. Range of motion & Thyroid  normal. Lungs: Normal respiratory effort; chest expands symmetrically. Lungs are clear to auscultation without rales, wheezes, or increased work of breathing. Heart: Normal rate and rhythm. Normal S1 and S2. No gallop, click, or rub. No murmur. Abdomen: Bowel sounds normal; abdomen soft and nontender. No masses or organomegaly .Small umbilical hernia is noted. Musculoskeletal/extremities: No deformity or scoliosis noted of  the thoracic or lumbar spine. No clubbing, cyanosis, edema, or significant extremity  deformity noted.  Range of motion normal . Tone & strength normal. Hand joints normal  Fingernail  health good. Slight crepitus of knees  Able to lie down & sit up w/o help.  Negative SLR bilaterally Vascular: Carotid, radial artery, dorsalis pedis and  posterior tibial pulses are full and equal. No bruits present. Neurologic: Alert and oriented x3. Deep tendon reflexes symmetrical and normal.  Gait normal        Skin: Intact without suspicious lesions or rashes. Lymph: No cervical, axillary lymphadenopathy present. Psych: Mood and affect are normal. Normally interactive                                                                                     Assessment & Plan:  #1See Current Assessment & Plan in Problem List under specific Diagnosis  #2 fatigue; see orders  #3 Small umbilical hernia; focus should be on getting waist < 40 inches

## 2014-08-11 NOTE — Patient Instructions (Signed)
Reflux of gastric acid may be asymptomatic as this may occur mainly during sleep.The triggers for reflux  include stress; the "aspirin family" ; alcohol; peppermint; and caffeine (coffee, tea, cola, and chocolate). The aspirin family would include aspirin and the nonsteroidal agents such as ibuprofen &  Naproxen. Tylenol would not cause reflux. If having symptoms ; food & drink should be avoided for @ least 2 hours before going to bed.    Please follow a Mediaterranean type diet  (many good cook books readily available) or review Dr Nunzio Cory book Eat, [place] for best  dietary cholesterol information & options.  Cardiovascular exercise, this can be as simple a program as walking, is recommended 30-45 minutes 3-4 times per week. If you're not exercising you should take 6-8 weeks to build up to this level.  If albuterol, the rescue agent, is needed more than 2-3 times per week except  to prevent exercise-induced symptoms; the maintenance agent should be used  preventatively on a daily basis.  Albuterol is a rescue inhaler which should be used as infrequently as possible; it should never be used more than 1-2 puffs every 4 hours. It may  be used 15-30 minutes before exercise if that is also  a trigger for your asthma. Symbicort , Dulera , Advair or other preferred maintenance agent should be continued to control smooth muscle spasm and airway inflammation  and to prevent adverse effects from excess albuterol use.Those adverse effects can include health or life threatening heart rhythm irregularities. Marland Kitchen

## 2014-08-13 NOTE — Assessment & Plan Note (Signed)
No change in therapy

## 2014-08-13 NOTE — Assessment & Plan Note (Signed)
Anti reflux protocol Weight loss

## 2014-09-28 DIAGNOSIS — K2100 Gastro-esophageal reflux disease with esophagitis, without bleeding: Secondary | ICD-10-CM | POA: Insufficient documentation

## 2014-09-28 DIAGNOSIS — J4520 Mild intermittent asthma, uncomplicated: Secondary | ICD-10-CM

## 2014-09-28 DIAGNOSIS — K222 Esophageal obstruction: Secondary | ICD-10-CM | POA: Insufficient documentation

## 2014-09-28 HISTORY — DX: Mild intermittent asthma, uncomplicated: J45.20

## 2014-09-28 HISTORY — DX: Esophageal obstruction: K22.2

## 2014-09-28 HISTORY — DX: Gastro-esophageal reflux disease with esophagitis, without bleeding: K21.00

## 2014-11-09 ENCOUNTER — Other Ambulatory Visit: Payer: Self-pay

## 2014-11-09 DIAGNOSIS — J4520 Mild intermittent asthma, uncomplicated: Secondary | ICD-10-CM

## 2014-11-09 MED ORDER — MOMETASONE FURO-FORMOTEROL FUM 200-5 MCG/ACT IN AERO: INHALATION_SPRAY | RESPIRATORY_TRACT | Status: DC

## 2015-09-02 ENCOUNTER — Emergency Department (HOSPITAL_COMMUNITY)
Admission: EM | Admit: 2015-09-02 | Discharge: 2015-09-02 | Disposition: A | Discharge status: Home / Self Care | Payer: Managed Care, Other (non HMO) | Attending: Emergency Medicine | Admitting: Emergency Medicine

## 2015-09-02 ENCOUNTER — Emergency Department (HOSPITAL_COMMUNITY): Payer: Managed Care, Other (non HMO)

## 2015-09-02 ENCOUNTER — Encounter (HOSPITAL_COMMUNITY): Payer: Self-pay | Admitting: Family Medicine

## 2015-09-02 DIAGNOSIS — I10 Essential (primary) hypertension: Secondary | ICD-10-CM | POA: Insufficient documentation

## 2015-09-02 DIAGNOSIS — Z9981 Dependence on supplemental oxygen: Secondary | ICD-10-CM | POA: Insufficient documentation

## 2015-09-02 DIAGNOSIS — E669 Obesity, unspecified: Secondary | ICD-10-CM | POA: Insufficient documentation

## 2015-09-02 DIAGNOSIS — K210 Gastro-esophageal reflux disease with esophagitis: Secondary | ICD-10-CM | POA: Diagnosis not present

## 2015-09-02 DIAGNOSIS — J45909 Unspecified asthma, uncomplicated: Secondary | ICD-10-CM | POA: Insufficient documentation

## 2015-09-02 DIAGNOSIS — Z7951 Long term (current) use of inhaled steroids: Secondary | ICD-10-CM | POA: Insufficient documentation

## 2015-09-02 DIAGNOSIS — G4730 Sleep apnea, unspecified: Secondary | ICD-10-CM | POA: Insufficient documentation

## 2015-09-02 DIAGNOSIS — Z79899 Other long term (current) drug therapy: Secondary | ICD-10-CM | POA: Insufficient documentation

## 2015-09-02 DIAGNOSIS — R0789 Other chest pain: Secondary | ICD-10-CM

## 2015-09-02 DIAGNOSIS — Z87891 Personal history of nicotine dependence: Secondary | ICD-10-CM | POA: Diagnosis not present

## 2015-09-02 LAB — BASIC METABOLIC PANEL
ANION GAP: 10 (ref 5–15)
BUN: 11 mg/dL (ref 6–20)
CO2: 25 mmol/L (ref 22–32)
CREATININE: 1.09 mg/dL (ref 0.61–1.24)
Calcium: 9.5 mg/dL (ref 8.9–10.3)
Chloride: 104 mmol/L (ref 101–111)
GFR calc Af Amer: >60 mL/min (ref >60)
GLUCOSE: 108 mg/dL — AB (ref 65–99)
Potassium: 4.2 mmol/L (ref 3.5–5.1)
Sodium: 139 mmol/L (ref 135–145)

## 2015-09-02 LAB — CBC
HCT: 48.4 % (ref 39.0–52.0)
Hemoglobin: 16.6 g/dL (ref 13.0–17.0)
MCH: 32.2 pg (ref 26.0–34.0)
MCHC: 34.3 g/dL (ref 30.0–36.0)
MCV: 93.8 fL (ref 78.0–100.0)
PLATELETS: 187 10*3/uL (ref 150–400)
RBC: 5.16 MIL/uL (ref 4.22–5.81)
RDW: 14.2 % (ref 11.5–15.5)
WBC: 7.1 10*3/uL (ref 4.0–10.5)

## 2015-09-02 LAB — I-STAT TROPONIN, ED
Troponin i, poc: 0 ng/mL (ref 0.00–0.08)
Troponin i, poc: 0 ng/mL (ref 0.00–0.08)

## 2015-09-02 LAB — D-DIMER, QUANTITATIVE (NOT AT ARMC)

## 2015-09-02 MED ORDER — AMLODIPINE BESYLATE 5 MG PO TABS
5.0000 mg | ORAL_TABLET | Freq: Once | ORAL | Status: AC
  Administered 2015-09-02: 5 mg via ORAL
  Filled 2015-09-02: qty 1

## 2015-09-02 MED ORDER — AMLODIPINE BESYLATE 5 MG PO TABS: 5.0000 mg | ORAL_TABLET | Freq: Every day | ORAL | Status: DC

## 2015-09-02 NOTE — Discharge Instructions (Signed)
You were seen in the emergency room today for evaluation of elevated blood pressure. Your blood pressure improved with medication. I will start you on Norvasc for your blood pressure. Please take one pill (5 mg) daily. Please follow up with your primary care provider next week for blood pressure re-check. Your labs today were otherwise normal. Your kidney function is normal. The chest discomfort you experienced today does not appear to be related to your heart or lungs. Chest pain can also be due to stress, musculoskeletal pain, or even acid reflux/heartburn. Please talk to your primary care provider about ongoing evaluation of your symptoms. Return to the ER for new or worsening symptoms.   Hypertension Hypertension, commonly called high blood pressure, is when the force of blood pumping through your arteries is too strong. Your arteries are the blood vessels that carry blood from your heart throughout your body. A blood pressure reading consists of a higher number over a lower number, such as 110/72. The higher number (systolic) is the pressure inside your arteries when your heart pumps. The lower number (diastolic) is the pressure inside your arteries when your heart relaxes. Ideally you want your blood pressure below 140/90. Hypertension forces your heart to work harder to pump blood. Your arteries may become narrow or stiff. Having untreated or uncontrolled hypertension can cause heart attack, stroke, kidney disease, and other problems. RISK FACTORS Some risk factors for high blood pressure are controllable. Others are not.  Risk factors you cannot control include:   Race. You may be at higher risk if you are African American.  Age. Risk increases with age.  Gender. Men are at higher risk than women before age 69 years. After age 32, women are at higher risk than men. Risk factors you can control include:  Not getting enough exercise or physical activity.  Being overweight.  Getting too  much fat, sugar, calories, or salt in your diet.  Drinking too much alcohol. SIGNS AND SYMPTOMS Hypertension does not usually cause signs or symptoms. Extremely high blood pressure (hypertensive crisis) may cause headache, anxiety, shortness of breath, and nosebleed. DIAGNOSIS To check if you have hypertension, your health care provider will measure your blood pressure while you are seated, with your arm held at the level of your heart. It should be measured at least twice using the same arm. Certain conditions can cause a difference in blood pressure between your right and left arms. A blood pressure reading that is higher than normal on one occasion does not mean that you need treatment. If it is not clear whether you have high blood pressure, you may be asked to return on a different day to have your blood pressure checked again. Or, you may be asked to monitor your blood pressure at home for 1 or more weeks. TREATMENT Treating high blood pressure includes making lifestyle changes and possibly taking medicine. Living a healthy lifestyle can help lower high blood pressure. You may need to change some of your habits. Lifestyle changes may include:  Following the DASH diet. This diet is high in fruits, vegetables, and whole grains. It is low in salt, red meat, and added sugars.  Keep your sodium intake below 2,300 mg per day.  Getting at least 30-45 minutes of aerobic exercise at least 4 times per week.  Losing weight if necessary.  Not smoking.  Limiting alcoholic beverages.  Learning ways to reduce stress. Your health care provider may prescribe medicine if lifestyle changes are not enough to get  your blood pressure under control, and if one of the following is true:  You are 73-78 years of age and your systolic blood pressure is above 140.  You are 13 years of age or older, and your systolic blood pressure is above 150.  Your diastolic blood pressure is above 90.  You have  diabetes, and your systolic blood pressure is over [id number] or your diastolic blood pressure is over 90.  You have kidney disease and your blood pressure is above 140/90.  You have heart disease and your blood pressure is above 140/90. Your personal target blood pressure may vary depending on your medical conditions, your age, and other factors. HOME CARE INSTRUCTIONS  Have your blood pressure rechecked as directed by your health care provider.   Take medicines only as directed by your health care provider. Follow the directions carefully. Blood pressure medicines must be taken as prescribed. The medicine does not work as well when you skip doses. Skipping doses also puts you at risk for problems.  Do not smoke.   Monitor your blood pressure at home as directed by your health care provider. SEEK MEDICAL CARE IF:   You think you are having a reaction to medicines taken.  You have recurrent headaches or feel dizzy.  You have swelling in your ankles.  You have trouble with your vision. SEEK IMMEDIATE MEDICAL CARE IF:  You develop a severe headache or confusion.  You have unusual weakness, numbness, or feel faint.  You have severe chest or abdominal pain.  You vomit repeatedly.  You have trouble breathing. MAKE SURE YOU:   Understand these instructions.  Will watch your condition.  Will get help right away if you are not doing well or get worse.   This information is not intended to replace advice given to you by your health care provider. Make sure you discuss any questions you have with your health care provider.   Document Released: 05/21/2005 Document Revised: 10/05/2014 Document Reviewed: 03/13/2013 Elsevier Interactive Patient Education [employer].

## 2015-09-02 NOTE — ED Notes (Signed)
Pt here for not feeling well over the past 2 weeks. sts BP has been elevated, tired. sts taking weight loss meds. Last time was Wednesday. sts some chest pain, like heartburn.

## 2015-09-02 NOTE — ED Provider Notes (Signed)
CSN: [id number]     Arrival date & time 09/02/15  1257 History   First MD Initiated Contact with Patient 09/02/15 1538     Chief Complaint  Patient presents with  . Hypertension    HPI  Bryan Barber is an 45 y.o. male with history of asthma, OSA, GERD who presents to the ED for evaluation of elevated blood pressure. He states that for the past two weeks his BP readings have been high at home, with systolic usually 123456 and diastolic 123456. He states that he has intermittently felt "woozy" and lightheaded. Notes he has felt fatigued. He states that today since this morning he has noticed intermittent dull substernal chest pain "that feels like heartburn." He states the episodes last for a few seconds and then resolve on their own. They are not associated with exertion or any particular activity. His wife states that pt has looked "very sweaty" throughout the day today. Pt denies SOB or DOE. Denies N/V, abdominal pain. Denies blurred vision or headache. Denies urinary issues. States nothing like this has ever happened before.   Past Medical History  Diagnosis Date  . OSA on CPAP   . Asthma   . HLD (hyperlipidemia)   . Allergic rhinitis   . Obesity   . GERD (gastroesophageal reflux disease) 08/20/2011  . EE (eosinophilic esophagitis) 99991111  . Multiple gastric polyps 08/20/2011   Past Surgical History  Procedure Laterality Date  . Wrist surgery  10/2010    cyst removed. Right wrist  . Wisdom tooth extraction    . Esophagogastroduodenoscopy (egd) with esophageal dilation  08/20/2011   Family History  Problem Relation Age of Onset  . Hypertension Father   . Sleep apnea Mother   . Colon cancer Neg Hx   . Heart disease Paternal Grandfather   . Pancreatic cancer Maternal Grandfather    Social History  Substance Use Topics  . Smoking status: Former Smoker -- 1.00 packs/day for 10 years    Types: Cigarettes    Quit date: 06/05/1995  . Smokeless tobacco: Never Used  . Alcohol Use:  2.4 oz/week    4 Cans of beer per week     Comment: 1-2 per day    Review of Systems  All other systems reviewed and are negative.     Allergies  Aspirin  Home Medications   Prior to Admission medications   Medication Sig Start Date End Date Taking? Authorizing Provider  albuterol (PROAIR HFA) 108 (90 BASE) MCG/ACT inhaler Inhale 2 puffs into the lungs every 6 (six) hours as needed. 09/08/12   Hendricks Limes, MD  FLUoxetine (PROZAC) 40 MG capsule Take 20 mg by mouth daily.     Historical Provider, MD  modafinil (PROVIGIL) 200 MG tablet Take 400 mg by mouth daily.      Historical Provider, MD  mometasone-formoterol [facility]) 200-5 MCG/ACT AERO USE 2 INHALATIONS ORALLY   INTO THE LUNGS DAILY 11/09/14   Hendricks Limes, MD  pantoprazole (PROTONIX) 40 MG tablet Take 1 tablet (40 mg total) by mouth daily. Take 30 minutes before breakfast 09/21/13 09/21/14  Gatha Mayer, MD   BP 159/109 mmHg  Pulse 115  Temp(Src) 98.3 F (36.8 C)  Resp 18  SpO2 97% Physical Exam  Constitutional: He is oriented to person, place, and time. No distress.  HENT:  Right Ear: External ear normal.  Left Ear: External ear normal.  Nose: Nose normal.  Mouth/Throat: Oropharynx is clear and moist. No oropharyngeal exudate.  Eyes: Conjunctivae and EOM are normal. Pupils are equal, round, and reactive to light.  Neck: Normal range of motion. Neck supple.  Cardiovascular: Normal rate, regular rhythm, normal heart sounds and intact distal pulses.   Pulmonary/Chest: Effort normal and breath sounds normal. No respiratory distress. He has no wheezes. He exhibits no tenderness.  Abdominal: Soft. Bowel sounds are normal. He exhibits no distension. There is no tenderness. There is no rebound and no guarding.  Musculoskeletal: He exhibits no edema.  Neurological: He is alert and oriented to person, place, and time. No cranial nerve deficit.  Skin: Skin is warm and dry. He is not diaphoretic.  Psychiatric: He has a  normal mood and affect.  Nursing note and vitals reviewed.  Filed Vitals:   09/02/15 1310 09/02/15 1630 09/02/15 1700 09/02/15 1730  BP: 159/109 131/97 141/95 124/94  Pulse: 115 78 76 73  Temp: 98.3 F (36.8 C)     Resp: 18 14 13 9   SpO2: 97% 98% 100% 99%     ED Course  Procedures (including critical care time) Labs Review Labs Reviewed  BASIC METABOLIC PANEL - Abnormal; Notable for the following:    Glucose, Bld 108 (*)    All other components within normal limits  CBC  D-DIMER, QUANTITATIVE (NOT AT [facility])  I-STAT TROPOININ, ED  Randolm Idol, ED    Imaging Review Dg Chest 2 View  09/02/2015  CLINICAL DATA:  High blood pressure for 2-3 weeks.  Chest pain. EXAM: CHEST  2 VIEW COMPARISON:  07/04/2010 FINDINGS: Heart and mediastinal contours are within normal limits. No focal opacities or effusions. No acute bony abnormality. IMPRESSION: No active cardiopulmonary disease. Electronically Signed   By: Rolm Baptise M.D.   On: 09/02/2015 13:37   I have personally reviewed and evaluated these images and lab results as part of my medical decision-making.   EKG Interpretation None      MDM   Final diagnoses:  Essential hypertension  Atypical chest pain    Labs unremarkable. Kidney function good. EKG not crossing over to [facility] but was nonacute. Will add d-dimer and delta troponin. BP is elevated and given report of persistent elevation I will start pt on Norvasc 5mg  QD, though no e/o end organ damage at this time.  BP improved with norvasc. D-dimer and delta trop are both negative. Discussed with pt at this time can r/o PE, ACS. Suspect HTN has been contributing to his generalized symptoms the past couple of weeks. Today's chest pain likely anxiety vs msk vs GERD. Instructed close PCP f/u next week. ER return precautions given.    Anne Ng, PA-C 09/02/15 [place], MD 09/03/15 [phone]

## 2015-09-05 ENCOUNTER — Telehealth: Payer: Self-pay | Admitting: *Deleted

## 2015-09-05 NOTE — Telephone Encounter (Signed)
Dr. Linna Darner forward email to manager [name]) pt was seen in ER due to blood pressure. Tried calling pt to set him up for ER f/u with new MD no answer LMOM RTC...Bryan Barber

## 2015-09-12 ENCOUNTER — Ambulatory Visit (INDEPENDENT_AMBULATORY_CARE_PROVIDER_SITE_OTHER): Payer: Managed Care, Other (non HMO) | Admitting: Family

## 2015-09-12 ENCOUNTER — Encounter: Payer: Self-pay | Admitting: Family

## 2015-09-12 VITALS — BP 122/88 | HR 98 | Temp 97.7°F | Resp 16 | Ht 68.0 in | Wt 222.8 lb

## 2015-09-12 DIAGNOSIS — J4530 Mild persistent asthma, uncomplicated: Secondary | ICD-10-CM | POA: Diagnosis not present

## 2015-09-12 DIAGNOSIS — G4726 Circadian rhythm sleep disorder, shift work type: Secondary | ICD-10-CM | POA: Diagnosis not present

## 2015-09-12 DIAGNOSIS — I10 Essential (primary) hypertension: Secondary | ICD-10-CM

## 2015-09-12 DIAGNOSIS — G4733 Obstructive sleep apnea (adult) (pediatric): Secondary | ICD-10-CM | POA: Diagnosis not present

## 2015-09-12 DIAGNOSIS — J4520 Mild intermittent asthma, uncomplicated: Secondary | ICD-10-CM | POA: Diagnosis not present

## 2015-09-12 MED ORDER — MOMETASONE FURO-FORMOTEROL FUM 200-5 MCG/ACT IN AERO: INHALATION_SPRAY | RESPIRATORY_TRACT | Status: DC

## 2015-09-12 MED ORDER — MODAFINIL 200 MG PO TABS: 400.0000 mg | ORAL_TABLET | Freq: Every day | ORAL | Status: DC

## 2015-09-12 MED ORDER — ALBUTEROL SULFATE HFA 108 (90 BASE) MCG/ACT IN AERS: 2.0000 | INHALATION_SPRAY | Freq: Four times a day (QID) | RESPIRATORY_TRACT | Status: DC | PRN

## 2015-09-12 MED ORDER — AMLODIPINE BESYLATE 5 MG PO TABS: 5.0000 mg | ORAL_TABLET | Freq: Every day | ORAL | Status: DC

## 2015-09-12 MED ORDER — FLUOXETINE HCL 40 MG PO CAPS: 40.0000 mg | ORAL_CAPSULE | Freq: Every day | ORAL | Status: DC

## 2015-09-12 NOTE — Assessment & Plan Note (Signed)
Asthma appears stable with current regimen without adverse side effects. Uses the albuterol inhaler minimally. Continue current dosage of [place] and albuterol. Follow-up if symptoms worsen or no longer well controlled.

## 2015-09-12 NOTE — Patient Instructions (Signed)
Thank you for choosing [facility].  Summary/Instructions:  Your prescription(s) have been submitted to your pharmacy or been printed and provided for you. Please take as directed and contact our office if you believe you are having problem(s) with the medication(s) or have any questions.  If your symptoms worsen or fail to improve, please contact our office for further instruction, or in case of emergency go directly to the emergency room at the closest medical facility.   Please continue to take your medications as prescribed.   They will call to schedule your appointment with Pulmonology/Sleep medicine.

## 2015-09-12 NOTE — Assessment & Plan Note (Signed)
Maintained on CPAP and indicates compliance with regimen. Has lost weight since previous sleep study and may need re-titration of his equipment. Refer to pulmonology to establish and for titration if necessary.

## 2015-09-12 NOTE — Progress Notes (Signed)
Pre visit review using our clinic review tool, if applicable. No additional management support is needed unless otherwise documented below in the visit note. 

## 2015-09-12 NOTE — Progress Notes (Signed)
Subjective:    Patient ID: Bryan Barber, male    DOB: [birth year], 45 y.o.   MRN: [id number]  Chief Complaint  Patient presents with  . Hospitalization Follow-up    bp still seems to be high at the bottom number, also states he has been clinching his jaws together alot and does not know if that has anything to do with the issues he has been having    HPI:  Bryan Barber is a 45 y.o. male who  has a past medical history of OSA on CPAP; Asthma; HLD (hyperlipidemia); Allergic rhinitis; Obesity; GERD (gastroesophageal reflux disease) (99991111); EE (eosinophilic esophagitis) (99991111); and Multiple gastric polyps (08/20/2011). and presents today for a follow up office visit.   1.) Hypertension - Recently evaluated in the emergency department for elevated blood pressure for approximately 2 weeks. Describes intermittent feelings of wooziness and lightheadedness as well as fatigue. Did describe some dull substernal chest pain that lasted for a few seconds and resolved. Physical exam was within normal limits with no irregularities. Blood work included troponin and d-dimer which were both negative. EKG indicated to be nonacute. He was started on Norvasc which helped to improve his blood pressure. Chest pain was related most likely to anxiety or possible GERD. He was instructed to follow-up with primary care.  Since leaving the ED he reports taking the amlodipine as prescribed and denies adverse side effects. Home blood pressure reading [id number] systolic and upper 99991111 diastolic. Denies symptoms of end organ damage or headaches. Describes that he is clinching his teeth through the day with increased frequency over the past couple of weeks. Denies any un  2.) Asthma - Currently maintained on albuterol and Dulera. Reports taking the medications as prescribed without adverse side effects. Takes the albuterol rarely. Reports his symptoms are well controlled.   3.) Shift work disorder / Sleep apena -   Currently managed on provigil and fluoxetine. Symptoms are adequately controlled and he uses his CPAP on a regular basis. He has lost weight since his last CPAP was prescribed and is in need of possible re-evaluation.   Allergies  Allergen Reactions  . Aspirin     Face swelling      Current Outpatient Prescriptions on File Prior to Visit  Medication Sig Dispense Refill  . [DISCONTINUED] montelukast (SINGULAIR) 10 MG tablet Take 1 tablet (10 mg total) by mouth at bedtime. 90 tablet 2  . [DISCONTINUED] ranitidine (ZANTAC) 150 MG tablet Take 1 tablet (150 mg total) by mouth 2 (two) times daily. 60 tablet 2   No current facility-administered medications on file prior to visit.     Past Surgical History  Procedure Laterality Date  . Wrist surgery  10/2010    cyst removed. Right wrist  . Wisdom tooth extraction    . Esophagogastroduodenoscopy (egd) with esophageal dilation  08/20/2011    Past Medical History  Diagnosis Date  . OSA on CPAP   . Asthma   . HLD (hyperlipidemia)   . Allergic rhinitis   . Obesity   . GERD (gastroesophageal reflux disease) 08/20/2011  . EE (eosinophilic esophagitis) 99991111  . Multiple gastric polyps 08/20/2011    Review of Systems  Constitutional: Negative for fever and chills.  Eyes:       Negative for changes in vision.  Respiratory: Negative for chest tightness and shortness of breath.   Cardiovascular: Negative for chest pain, palpitations and leg swelling.  Neurological: Negative for headaches.  Psychiatric/Behavioral: Positive for sleep disturbance.  Objective:    BP 122/88 mmHg  Pulse 98  Temp(Src) 97.7 F (36.5 C) (Oral)  Resp 16  Ht 5\' 8"  (1.727 m)  Wt 222 lb 12.8 oz (101.061 kg)  BMI 33.88 kg/m2  SpO2 97% Nursing note and vital signs reviewed.  Physical Exam  Constitutional: He is oriented to person, place, and time. He appears well-developed and well-nourished. No distress.  Cardiovascular: Normal rate, regular rhythm,  normal heart sounds and intact distal pulses.   Pulmonary/Chest: Effort normal and breath sounds normal. No respiratory distress. He has no wheezes. He has no rales. He exhibits no tenderness.  Neurological: He is alert and oriented to person, place, and time.  Skin: Skin is warm and dry.  Psychiatric: His behavior is normal. Judgment and thought content normal. His mood appears anxious.       Assessment & Plan:   Problem List Items Addressed This Visit      Cardiovascular and Mediastinum   Essential hypertension    Blood pressure appears well controlled with addition of amlodipine and below goal 140/90. Continue current dosage of amlodipine. Encouraged to monitor blood pressure at home. Follow-up in 2 weeks with blood pressure readings to determine continued effectiveness.      Relevant Medications   amLODipine (NORVASC) 5 MG tablet     Respiratory   Obstructive sleep apnea    Maintained on CPAP and indicates compliance with regimen. Has lost weight since previous sleep study and may need re-titration of his equipment. Refer to pulmonology to establish and for titration if necessary.      Relevant Orders   Ambulatory referral to Pulmonology   Asthma    Asthma appears stable with current regimen without adverse side effects. Uses the albuterol inhaler minimally. Continue current dosage of [place] and albuterol. Follow-up if symptoms worsen or no longer well controlled.      Relevant Medications   albuterol (PROAIR HFA) 108 (90 Base) MCG/ACT inhaler   mometasone-formoterol (DULERA) 200-5 MCG/ACT AERO     Other   Shift work sleep disorder    Continues to experience shift work sleep disorder and maintained on Provigil and fluoxetine which helps his symptoms as he continues to work night shift. Continue current dosage of Provigil fluoxetine given no adverse side effects. Refer to pulmonology to establish care for CPAP and sleep medicine.      Relevant Medications   FLUoxetine  (PROZAC) 40 MG capsule   modafinil (PROVIGIL) 200 MG tablet   Other Relevant Orders   Ambulatory referral to Pulmonology    Other Visit Diagnoses    Intrinsic asthma, mild intermittent, uncomplicated    -  Primary    Relevant Medications    albuterol (PROAIR HFA) 108 (90 Base) MCG/ACT inhaler    mometasone-formoterol (DULERA) 200-5 MCG/ACT AERO        I have discontinued Mr. Goebel pantoprazole and phentermine. I have also changed his FLUoxetine and modafinil. Additionally, I am having him maintain his albuterol, amLODipine, and mometasone-formoterol.   Meds ordered this encounter  Medications  . DISCONTD: phentermine 37.5 MG capsule    Sig: Take 37.5 mg by mouth every morning.  Marland Kitchen albuterol (PROAIR HFA) 108 (90 Base) MCG/ACT inhaler    Sig: Inhale 2 puffs into the lungs every 6 (six) hours as needed.    Dispense:  3 Inhaler    Refill:  1    Order Specific Question:  Supervising Provider    Answer:  Pricilla Holm A [id number]  . FLUoxetine (PROZAC) 40  MG capsule    Sig: Take 1 capsule (40 mg total) by mouth daily.    Dispense:  90 capsule    Refill:  0    Order Specific Question:  Supervising Provider    Answer:  Pricilla Holm A [id number]  . modafinil (PROVIGIL) 200 MG tablet    Sig: Take 2 tablets (400 mg total) by mouth daily.    Dispense:  180 tablet    Refill:  0    Order Specific Question:  Supervising Provider    Answer:  Pricilla Holm A [id number]  . amLODipine (NORVASC) 5 MG tablet    Sig: Take 1 tablet (5 mg total) by mouth daily.    Dispense:  90 tablet    Refill:  0    Order Specific Question:  Supervising Provider    Answer:  Pricilla Holm A [id number]  . mometasone-formoterol (DULERA) 200-5 MCG/ACT AERO    Sig: USE 2 INHALATIONS ORALLY   INTO THE LUNGS DAILY    Dispense:  13 g    Refill:  11    Order Specific Question:  Supervising Provider    Answer:  Pricilla Holm A [id number]     Follow-up: Return in about 3 months (around  12/12/2015).  Mauricio Po, FNP

## 2015-09-12 NOTE — Assessment & Plan Note (Signed)
Blood pressure appears well controlled with addition of amlodipine and below goal 140/90. Continue current dosage of amlodipine. Encouraged to monitor blood pressure at home. Follow-up in 2 weeks with blood pressure readings to determine continued effectiveness.

## 2015-09-12 NOTE — Assessment & Plan Note (Signed)
Continues to experience shift work sleep disorder and maintained on Provigil and fluoxetine which helps his symptoms as he continues to work night shift. Continue current dosage of Provigil fluoxetine given no adverse side effects. Refer to pulmonology to establish care for CPAP and sleep medicine.

## 2015-12-01 ENCOUNTER — Ambulatory Visit: Payer: Managed Care, Other (non HMO) | Admitting: Family

## 2015-12-05 ENCOUNTER — Ambulatory Visit (INDEPENDENT_AMBULATORY_CARE_PROVIDER_SITE_OTHER): Payer: Managed Care, Other (non HMO) | Admitting: Family

## 2015-12-05 ENCOUNTER — Encounter: Payer: Self-pay | Admitting: Family

## 2015-12-05 VITALS — BP 120/88 | HR 92 | Temp 97.8°F | Resp 18 | Ht 68.0 in | Wt 227.0 lb

## 2015-12-05 DIAGNOSIS — R6884 Jaw pain: Secondary | ICD-10-CM | POA: Diagnosis not present

## 2015-12-05 DIAGNOSIS — H00013 Hordeolum externum right eye, unspecified eyelid: Secondary | ICD-10-CM

## 2015-12-05 DIAGNOSIS — H00019 Hordeolum externum unspecified eye, unspecified eyelid: Secondary | ICD-10-CM | POA: Insufficient documentation

## 2015-12-05 MED ORDER — DIAZEPAM 5 MG PO TABS: 5.0000 mg | ORAL_TABLET | Freq: Every evening | ORAL | Status: DC | PRN

## 2015-12-05 NOTE — Progress Notes (Signed)
Subjective:    Patient ID: Bryan Barber, male    DOB: 1971/02/05, 45 y.o.   MRN: [id number]  Chief Complaint  Patient presents with  . Jaw Pain    x1 month, has had jaw pain on both sides, right eye irritation     HPI:  Bryan Barber is a 45 y.o. male who  has a past medical history of OSA on CPAP; Asthma; HLD (hyperlipidemia); Allergic rhinitis; Obesity; GERD (gastroesophageal reflux disease) (99991111); EE (eosinophilic esophagitis) (99991111); and Multiple gastric polyps (08/20/2011). and presents today for an office visit.  1.) Jaw pain - This is a new problem. Associated symptom of pain located on bilateral aspects of his jaw have been going on for about 1 month. Described as achy from the clenching with occasional throbbing. Modifying factors include Tylenol and ibuprofen which has helped a little.  2.) Eye irritation - This is a new problem. Associated symptom irritation of the right eye has been going on for a while. Recently diagnosed with a stye and treated with antibiotic which has helped. Describes a sensation of something being in his eye. Denies any burning, discharge or itching. Notes that when his contacts are in it feels better and then worsens when they are taken out. He continues to complete the course of antibiotics. No changes in vision. Symptoms have generally been waxing and waning,.   Allergies  Allergen Reactions  . Aspirin     Face swelling      Current Outpatient Prescriptions on File Prior to Visit  Medication Sig Dispense Refill  . albuterol (PROAIR HFA) 108 (90 Base) MCG/ACT inhaler Inhale 2 puffs into the lungs every 6 (six) hours as needed. 3 Inhaler 1  . amLODipine (NORVASC) 5 MG tablet Take 1 tablet (5 mg total) by mouth daily. 90 tablet 0  . FLUoxetine (PROZAC) 40 MG capsule Take 1 capsule (40 mg total) by mouth daily. 90 capsule 0  . modafinil (PROVIGIL) 200 MG tablet Take 2 tablets (400 mg total) by mouth daily. 180 tablet 0  .  mometasone-formoterol (DULERA) 200-5 MCG/ACT AERO USE 2 INHALATIONS ORALLY   INTO THE LUNGS DAILY 13 g 11  . [DISCONTINUED] montelukast (SINGULAIR) 10 MG tablet Take 1 tablet (10 mg total) by mouth at bedtime. 90 tablet 2  . [DISCONTINUED] ranitidine (ZANTAC) 150 MG tablet Take 1 tablet (150 mg total) by mouth 2 (two) times daily. 60 tablet 2   No current facility-administered medications on file prior to visit.    Past Medical History  Diagnosis Date  . OSA on CPAP   . Asthma   . HLD (hyperlipidemia)   . Allergic rhinitis   . Obesity   . GERD (gastroesophageal reflux disease) 08/20/2011  . EE (eosinophilic esophagitis) 99991111  . Multiple gastric polyps 08/20/2011      Review of Systems  Constitutional: Negative for fever and chills.  HENT:       Positive for bilateral jaw pain  Eyes: Positive for pain and redness.  Neurological: Negative for dizziness, weakness, light-headedness and headaches.      Objective:    BP 120/88 mmHg  Pulse 92  Temp(Src) 97.8 F (36.6 C) (Oral)  Resp 18  Ht 5\' 8"  (1.727 m)  Wt 227 lb (102.967 kg)  BMI 34.52 kg/m2  SpO2 97% Nursing note and vital signs reviewed.  Physical Exam  Constitutional: He is oriented to person, place, and time. He appears well-developed and well-nourished. No distress.  HENT:  Jaw with no  obvious deformity, discoloration or edema. No clicking or malalignment present.   Eyes: Pupils are equal, round, and reactive to light. Right eye exhibits no chemosis, no discharge, no exudate and no hordeolum. No foreign body present in the right eye. Left eye exhibits no chemosis, no discharge, no exudate and no hordeolum. No foreign body present in the left eye. Right conjunctiva is injected. Left conjunctiva is not injected. Left conjunctiva has no hemorrhage. Right eye exhibits normal extraocular motion and no nystagmus.  Cardiovascular: Normal rate, regular rhythm, normal heart sounds and intact distal pulses.     Pulmonary/Chest: Effort normal and breath sounds normal.  Neurological: He is alert and oriented to person, place, and time.  Skin: Skin is warm and dry.  Psychiatric: He has a normal mood and affect. His behavior is normal. Judgment and thought content normal.       Assessment & Plan:   Problem List Items Addressed This Visit      Other   Jaw pain - Primary    Jaw pain most consistent with clenching of teeth of undetermined origin as patient indicates he is not overly stressed. Start diazepam as needed for muscle relaxation. Encouraged to follow-up with dentistry for possible mouthguard or other intervention. There is mild concern of fluoxetine related side effect, however unlikely given length of time patient has been on medication. Instructed to decrease fluoxetine down to 20 mg daily 1 week to determine effectiveness. Follow-up with primary care if symptoms worsen or do not improve.      Relevant Medications   diazepam (VALIUM) 5 MG tablet   Stye    Recently diagnosed with stye of right eye and currently maintained on Keflex with completion of the next 3-4 days. Continues to experience eye redness and mild discomfort on occasion. Question possible reinfection with contact lenses. Advised to use disposable contact lenses until infections resolve. Follow-up with ophthalmology if symptoms worsen or do not improve.          I am having Mr. Bryan Barber start on diazepam. I am also having him maintain his albuterol, FLUoxetine, modafinil, amLODipine, and mometasone-formoterol.   Meds ordered this encounter  Medications  . diazepam (VALIUM) 5 MG tablet    Sig: Take 1-2 tablets (5-10 mg total) by mouth at bedtime as needed for muscle spasms.    Dispense:  60 tablet    Refill:  0    Order Specific Question:  Supervising Provider    Answer:  Pricilla Holm A [id number]     Follow-up: No Follow-up on file.  Mauricio Po, FNP

## 2015-12-05 NOTE — Patient Instructions (Signed)
Thank you for choosing [facility].  Summary/Instructions:  Please use daily contacts until infection has resolved.   Follow up with opthalmology for any continued infection.  Start the diazepam as needed for teeth clenching at night.   Follow up with dentistry for possible mouthguard,  If related to fluoxetine, try decreasing dose to 20 mg for about 1 week to see if symptoms improve.  Your prescription(s) have been submitted to your pharmacy or been printed and provided for you. Please take as directed and contact our office if you believe you are having problem(s) with the medication(s) or have any questions.  If your symptoms worsen or fail to improve, please contact our office for further instruction, or in case of emergency go directly to the emergency room at the closest medical facility.   Teeth Grinding Teeth grinding is when you grind or clench your teeth. This condition is also called bruxism. You can grind or clench your teeth without knowing that you are doing it. It can be done during the day or at night while you are sleeping. This can lead to tooth damage and jaw pain. This condition is common. CAUSES The cause of this condition is not known. However, bruxism may be triggered by:  Improperly aligned teeth (malocclusion).  Stress or worry.  Earaches.  Allergies.  Some medicines.  Upper respiratory infections.  Sleep disorders. RISK FACTORS This condition is more likely to develop in:  Children.  People who have a family history of teeth grinding.  People who consume nicotine, caffeine, or excess amounts of alcoholbefore sleeping.  People who are stressed. SYMPTOMS Symptoms of this condition include:  Earaches.  Teeth that are sensitive to heat, cold, and sweetness.  Damaged teeth.  Jaw pain or facial pain.  Headaches.  Muscle spasms.  Jaw problems, such as hearing a clicking sound when you open or close your mouth.  Difficulty  sleeping.  Difficulty eating. In some cases, there are no symptoms. DIAGNOSIS This condition is diagnosed with a medical history and dental exam. To rule out other conditions, you may also have other tests, including:  X-rays.  Blood tests. TREATMENT There is no cure for this condition, but treatment can help to control your symptoms and prevent further damage to your teeth. Treatment may include:  A nightguard. This is a mouthguard that is fitted to your teeth. It places a barrier between your top teeth and your bottom teeth so that you grind on the nightguard instead.  A mouthguard that moves your lower jaw forward (mandibular advancement devices).  Dental correction of misaligned teeth. This may include braces.  Medicine to relax your jaw muscles.  Methods to reduce your stress, such as:  Hypnosis.  Psychiatric treatment or counseling.  Relaxation techniques to use before going to sleep. HOME CARE INSTRUCTIONS Lifestyle  Try to reduce your stress, such as with exercise, yoga, or meditation. Talk with your health care provider if you need help to reduce your stress.  Try to get eight hours of sleep every night. Sleep in a cool, dark, and quiet room. Keep computers and work out of the bedroom.  Avoid sleeping on your back. Try sleeping on your side or your abdomen.  Avoid:  Chewing gum.  Eating tough or hard foods, such as candy.  Alcohol, caffeine, and nicotine, especially before bedtime.  Drink enough fluid to keep your urine clear or pale yellow.  Try to keep your face, mouth, and jaw muscles relaxed. General Instructions  If you were  given a mouthguard, wear it as directed by your health care provider.  Take medicines only as directed by your health care provider.  Your health care provider may recommend applying a warm compress to your face to help relax your jaw muscles. Do this as directed by your health care provider.  Keep all follow-up visits as  directed by your health care provider. This is important.  Do jaw exercises as directed by your health care provider. SEEK MEDICAL CARE IF:  Your symptoms get worse.  You have new symptoms.  You have trouble eating.  You have trouble opening your mouth.   This information is not intended to replace advice given to you by your health care provider. Make sure you discuss any questions you have with your health care provider.   Document Released: 05/24/2003 Document Revised: 10/05/2014 Document Reviewed: 05/17/2014 Elsevier Interactive Patient Education [employer].

## 2015-12-05 NOTE — Assessment & Plan Note (Signed)
Recently diagnosed with stye of right eye and currently maintained on Keflex with completion of the next 3-4 days. Continues to experience eye redness and mild discomfort on occasion. Question possible reinfection with contact lenses. Advised to use disposable contact lenses until infections resolve. Follow-up with ophthalmology if symptoms worsen or do not improve.

## 2015-12-05 NOTE — Assessment & Plan Note (Signed)
Jaw pain most consistent with clenching of teeth of undetermined origin as patient indicates he is not overly stressed. Start diazepam as needed for muscle relaxation. Encouraged to follow-up with dentistry for possible mouthguard or other intervention. There is mild concern of fluoxetine related side effect, however unlikely given length of time patient has been on medication. Instructed to decrease fluoxetine down to 20 mg daily 1 week to determine effectiveness. Follow-up with primary care if symptoms worsen or do not improve.

## 2015-12-05 NOTE — Progress Notes (Signed)
Pre visit review using our clinic review tool, if applicable. No additional management support is needed unless otherwise documented below in the visit note. 

## 2015-12-12 ENCOUNTER — Other Ambulatory Visit: Payer: Self-pay | Admitting: Family

## 2015-12-14 NOTE — Telephone Encounter (Signed)
Rec'd call from [facility] requesting status on Provigil refill....Bryan Barber

## 2015-12-15 NOTE — Telephone Encounter (Signed)
Faxed script back to [facility].../lmb 

## 2016-01-25 ENCOUNTER — Encounter: Payer: Self-pay | Admitting: Pulmonary Disease

## 2016-01-25 ENCOUNTER — Ambulatory Visit (INDEPENDENT_AMBULATORY_CARE_PROVIDER_SITE_OTHER): Payer: Managed Care, Other (non HMO) | Admitting: Pulmonary Disease

## 2016-01-25 VITALS — BP 132/82 | HR 98 | Ht 68.0 in | Wt 233.0 lb

## 2016-01-25 DIAGNOSIS — G4733 Obstructive sleep apnea (adult) (pediatric): Secondary | ICD-10-CM | POA: Diagnosis not present

## 2016-01-25 DIAGNOSIS — G4710 Hypersomnia, unspecified: Secondary | ICD-10-CM

## 2016-01-25 DIAGNOSIS — G4726 Circadian rhythm sleep disorder, shift work type: Secondary | ICD-10-CM

## 2016-01-25 DIAGNOSIS — Z9989 Dependence on other enabling machines and devices: Principal | ICD-10-CM

## 2016-01-25 NOTE — Patient Instructions (Signed)
Please drop off your CPAP report  Follow up in 2 months

## 2016-01-25 NOTE — Progress Notes (Signed)
Past Surgical History He  has a past surgical history that includes Wrist surgery (10/2010); Wisdom tooth extraction; and Esophagogastroduodenoscopy (egd) with esophageal dilation (08/20/2011).  Allergies  Allergen Reactions  . Aspirin     Face swelling     Family History His family history includes Heart disease in his paternal grandfather; Hypertension in his father; Pancreatic cancer in his maternal grandfather; Sleep apnea in his mother.  Social History He  reports that he quit smoking about 20 years ago. His smoking use included Cigarettes. He has a 10.00 pack-year smoking history. He has never used smokeless tobacco. He reports that he does not drink alcohol or use drugs.  Review of systems Constitutional: Negative for fever and unexpected weight change.  HENT: Positive for sneezing. Negative for congestion, dental problem, ear pain, nosebleeds, postnasal drip, rhinorrhea, sinus pressure, sore throat and trouble swallowing.   Eyes: Negative for redness and itching.  Respiratory: Positive for shortness of breath. Negative for cough, chest tightness and wheezing.   Cardiovascular: Negative for palpitations and leg swelling.  Gastrointestinal: Negative for nausea and vomiting.  Genitourinary: Negative for dysuria.  Musculoskeletal: Negative for joint swelling.  Skin: Negative for rash.  Neurological: Negative for headaches.  Hematological: Does not bruise/bleed easily.  Psychiatric/Behavioral: Negative for dysphoric mood. The patient is not nervous/anxious.     Current Outpatient Prescriptions on File Prior to Visit  Medication Sig  . albuterol (PROAIR HFA) 108 (90 Base) MCG/ACT inhaler Inhale 2 puffs into the lungs every 6 (six) hours as needed.  Marland Kitchen amLODipine (NORVASC) 5 MG tablet TAKE 1 TABLET (5 MG TOTAL) BY MOUTH DAILY.  . diazepam (VALIUM) 5 MG tablet Take 1-2 tablets (5-10 mg total) by mouth at bedtime as needed for muscle spasms.  Marland Kitchen FLUoxetine (PROZAC) 40 MG capsule Take 1  capsule (40 mg total) by mouth daily. (Patient taking differently: Take 10 mg by mouth daily. )  . modafinil (PROVIGIL) 200 MG tablet TAKE 2 TABLETS BY MOUTH EVERY DAY  . mometasone-formoterol (DULERA) 200-5 MCG/ACT AERO USE 2 INHALATIONS ORALLY   INTO THE LUNGS DAILY  . [DISCONTINUED] montelukast (SINGULAIR) 10 MG tablet Take 1 tablet (10 mg total) by mouth at bedtime.  . [DISCONTINUED] ranitidine (ZANTAC) 150 MG tablet Take 1 tablet (150 mg total) by mouth 2 (two) times daily.   No current facility-administered medications on file prior to visit.     Chief Complaint  Patient presents with  . sleep consult    per Mauricio Po. former [facility] pt last seen 11/2010, last sleep study in 2009. currently wearing cpap avg 6hr nightly, feels pressure & mask are ok. waking not feeling well rested. EPWORTH:16    Tests: PSG 04/13/05 >> AHI 60  Past medical history He  has a past medical history of Allergic rhinitis; Asthma; EE (eosinophilic esophagitis) (99991111); GERD (gastroesophageal reflux disease) (08/20/2011); HLD (hyperlipidemia); Hypertension; Multiple gastric polyps (08/20/2011); Obesity; and OSA on CPAP.  Vital signs BP 132/82 (BP Location: Left Arm, Cuff Size: Normal)   Pulse 98   Ht 5\' 8"  (1.727 m)   Wt 233 lb (105.7 kg)   SpO2 97%   BMI 35.43 kg/m   History of Present Illness Bryan Barber is a 45 y.o. male for evaluation of sleep problems.  He has a history of sleep apnea.  He has been using CPAP for years.  He buys his supplies from [facility] [place] >> DME was too expensive.    He lost about 70 lbs, but then regained about 40 lbs.  He gets about 5 hrs sleep on days he works, and up to 11 hrs on days he is off.  He feels tired and no energy all the time.  He can't keep up with his schedule when he is not working.  He takes modafanil once or twice per day, but this doesn't getting him fully refreshed.  He thinks his mood is okay.    He denies sleep walking, sleep talking, bruxism, or  nightmares.  There is no history of restless legs.  He denies sleep hallucinations, sleep paralysis, or cataplexy.  The Epworth score is 16 out of 24.   Physical Exam:  General - No distress ENT - No sinus tenderness, no oral exudate, no LAN, no thyromegaly, TM clear, pupils equal/reactive, MP 3, 2+ tonsils Cardiac - s1s2 regular, no murmur, pulses symmetric Chest - No wheeze/rales/dullness, good air entry, normal respiratory excursion Back - No focal tenderness Abd - Soft, non-tender, no organomegaly, + bowel sounds Ext - No edema Neuro - Normal strength, cranial nerves intact Skin - No rashes Psych - Normal mood, and behavior  Discussion: He has hx of severe obstructive sleep apnea.  He has variable shift work schedule.  He has persistent daytime sleepiness.  Assessment/plan:  Obstructive sleep apnea. - will have him drop off his CPAP report and then determine if he can have pressure setting adjusted - he might need to have repeat in lab titration study  Shift work syndrome. - discussed importance of regular sleep/wake schedule  Persistent daytime hypersomnolence. - continue modafanil for now   Patient Instructions  Please drop off your CPAP report  Follow up in 2 months    Chesley Mires, M.D. Pager [phone] 01/25/2016, 4:03 PM

## 2016-01-25 NOTE — Progress Notes (Signed)
   Subjective:    Patient ID: Bryan Barber, male    DOB: 13-Mar-1971, 45 y.o.   MRN: [id number]  HPI    Review of Systems  Constitutional: Negative for fever and unexpected weight change.  HENT: Positive for sneezing. Negative for congestion, dental problem, ear pain, nosebleeds, postnasal drip, rhinorrhea, sinus pressure, sore throat and trouble swallowing.   Eyes: Negative for redness and itching.  Respiratory: Positive for shortness of breath. Negative for cough, chest tightness and wheezing.   Cardiovascular: Negative for palpitations and leg swelling.  Gastrointestinal: Negative for nausea and vomiting.  Genitourinary: Negative for dysuria.  Musculoskeletal: Negative for joint swelling.  Skin: Negative for rash.  Neurological: Negative for headaches.  Hematological: Does not bruise/bleed easily.  Psychiatric/Behavioral: Negative for dysphoric mood. The patient is not nervous/anxious.        Objective:   Physical Exam        Assessment & Plan:

## 2016-03-02 ENCOUNTER — Telehealth: Payer: Self-pay | Admitting: Pulmonary Disease

## 2016-03-02 NOTE — Telephone Encounter (Signed)
Checked with Ashtyn as this is not scanned into pt chart and no documentation made of paperwork being received.  She states that she has not received anything on this pt.  atc pt X2, line rang to fast busy signal.  Wcb.

## 2016-03-05 NOTE — Telephone Encounter (Signed)
LMTCB

## 2016-03-05 NOTE — Telephone Encounter (Signed)
Spoke with pt and advised him that the download he left has not been located. I asked pt if he could drop off another copy for VS to review. Pt agrees. Pt states that his medication is almost out and is requesting a refill of Modafinil.  VS - Please advise. Thanks!

## 2016-03-11 NOTE — Telephone Encounter (Signed)
Okay to send refill for modafinil. 

## 2016-03-12 MED ORDER — MODAFINIL 200 MG PO TABS
400.0000 mg | ORAL_TABLET | Freq: Every day | ORAL | 0 refills | Status: DC
Start: 2016-03-12 — End: 2016-06-27

## 2016-03-12 NOTE — Telephone Encounter (Signed)
Dr Halford Chessman has this Download -- will send to him as the patient is calling for results.  Please advise Dr Halford Chessman. Thanks.

## 2016-03-12 NOTE — Telephone Encounter (Signed)
Rx refilled for Modafinil to [facility].  Pt to bring by CPAP download at his convenience. Will hold to follow up.

## 2016-03-13 ENCOUNTER — Other Ambulatory Visit: Payer: Self-pay | Admitting: Family

## 2016-03-14 NOTE — Telephone Encounter (Signed)
CPAP 01/03/16 to 01/29/16 >> used on 27 of 27 nights with average of 8 hrs 44 min.  Average AHI 1.8 with CPAP 12 cm H2O   Please inform pt that CPAP report shows excellent control of sleep apnea.  No change to current settings needed.

## 2016-03-15 NOTE — Telephone Encounter (Signed)
Spoke with pt and advised of cpap download results.  Pt verbalized understanding.  Nothing further needed.

## 2016-04-27 ENCOUNTER — Ambulatory Visit: Payer: Managed Care, Other (non HMO)

## 2016-04-28 ENCOUNTER — Ambulatory Visit (INDEPENDENT_AMBULATORY_CARE_PROVIDER_SITE_OTHER): Payer: Managed Care, Other (non HMO) | Admitting: Family Medicine

## 2016-04-28 VITALS — BP 124/80 | HR 110 | Temp 98.2°F | Resp 18 | Ht 68.0 in | Wt 240.0 lb

## 2016-04-28 DIAGNOSIS — R059 Cough, unspecified: Secondary | ICD-10-CM

## 2016-04-28 DIAGNOSIS — J4520 Mild intermittent asthma, uncomplicated: Secondary | ICD-10-CM | POA: Diagnosis not present

## 2016-04-28 DIAGNOSIS — R05 Cough: Secondary | ICD-10-CM

## 2016-04-28 MED ORDER — AZITHROMYCIN 250 MG PO TABS: ORAL_TABLET | ORAL | 0 refills | Status: DC

## 2016-04-28 MED ORDER — PREDNISONE 20 MG PO TABS: 20.0000 mg | ORAL_TABLET | Freq: Every day | ORAL | 0 refills | Status: DC

## 2016-04-28 MED ORDER — GUAIFENESIN-CODEINE 100-10 MG/5ML PO SOLN: 5.0000 mL | Freq: Every evening | ORAL | 0 refills | Status: DC | PRN

## 2016-04-28 NOTE — Progress Notes (Signed)
Subjective:  By signing my name below, I, Moises Blood, attest that this documentation has been prepared under the direction and in the presence of Merri Ray, MD. Electronically Signed: Moises Blood, [place]. 04/28/2016 , 2:00 PM .  Patient was seen in Room 12 .   Patient ID: Bryan Barber, male    DOB: 1971-04-30, 45 y.o.   MRN: [id number] Chief Complaint  Patient presents with  . Cough  . Sinusitis   HPI Bryan Barber is a 45 y.o. male  Patient has history of asthma, GERD, OCD, OSA, and HTN.  Patient complains of dry, hacking coughing fits with sore throat and head congestion that started for about a week now. He noticed some yellowish mucus in the morning. He hasn't taken any decongestants or cough medication recently. He's been using a gas log and has increased dry air at home. He hasn't had much problems with chest congestion. His symptoms have been about the same, not improved or worsened. He denies any known sick contact. He denies any measured fever. He's been using his albuterol inhaler about 2~3 times a day, last used this morning.   He's taken tessalon perles in the past without much relief. He mentions only having relief with codeine medication. He denies having problems with narcotics in the past. He notes last taken prednisone about last year this time.   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   Patient Active Problem List   Diagnosis Date Noted  . Jaw pain 12/05/2015  . Stye 12/05/2015  . Shift work sleep disorder 09/12/2015  . Essential hypertension 09/12/2015  . Obesity, Class II, BMI 35-39.9, with comorbidity 09/21/2013  . BMI 36.0-36.9,adult 10/22/2011  . Seasonal allergic rhinitis 10/08/2011  . Eosinophilic esophagitis 0000000  .  GERD - erosive esophagitis 08/13/2011  . Inadequate sleep hygiene 11/21/2010  . HYPERLIPIDEMIA 01/25/2009  . OBSESSIVE-COMPULSIVE DISORDER 01/25/2009  . Obstructive sleep apnea 10/14/2007  . Asthma 07/22/2007    Past Medical History:  Diagnosis Date  . Allergic rhinitis   . Asthma   . EE (eosinophilic esophagitis) 99991111  . GERD (gastroesophageal reflux disease) 08/20/2011  . HLD (hyperlipidemia)   . Hypertension   . Multiple gastric polyps 08/20/2011  . Obesity   . OSA on CPAP    Past Surgical History:  Procedure Laterality Date  . ESOPHAGOGASTRODUODENOSCOPY (EGD) WITH ESOPHAGEAL DILATION  08/20/2011  . WISDOM TOOTH EXTRACTION    . WRIST SURGERY  10/2010   cyst removed. Right wrist   Allergies  Allergen Reactions  . Aspirin     Face swelling    Prior to Admission medications   Medication Sig Start Date End Date Taking? Authorizing Provider  albuterol (PROAIR HFA) 108 (90 Base) MCG/ACT inhaler Inhale 2 puffs into the lungs every 6 (six) hours as needed. 09/12/15  Yes Golden Circle, FNP  amLODipine (NORVASC) 5 MG tablet TAKE 1 TABLET (5 MG TOTAL) BY MOUTH DAILY. 03/14/16  Yes Golden Circle, FNP  diazepam (VALIUM) 5 MG tablet Take 1-2 tablets (5-10 mg total) by mouth at bedtime as needed for muscle spasms. 12/05/15  Yes Golden Circle, FNP  FLUoxetine (PROZAC) 40 MG capsule Take 1 capsule (40 mg total) by mouth daily. Patient taking differently: Take 10 mg by mouth daily.  09/12/15  Yes Golden Circle, FNP  modafinil (PROVIGIL) 200 MG tablet Take 2 tablets (400 mg total) by mouth daily. 03/12/16  Yes Chesley Mires, MD  mometasone-formoterol (DULERA) 200-5 MCG/ACT AERO USE  2 INHALATIONS ORALLY   INTO THE LUNGS DAILY 09/12/15  Yes Golden Circle, FNP   Social History   Social History  . Marital status: Married    Spouse name: N/A  . Number of children: 1  . Years of education: N/A   Occupational History  . BUYER [employer]   Social History Main Topics  . Smoking status: Former Smoker    Packs/day: 1.00    Years: 10.00    Types: Cigarettes    Quit date: 06/05/1995  . Smokeless tobacco: Never Used  . Alcohol use No  . Drug use: No  . Sexual activity: Not on file     Comment:  Long time ago    Other Topics Concern  . Not on file   Social History Narrative  . No narrative on file   Review of Systems  Constitutional: Positive for fatigue. Negative for chills and fever.  HENT: Positive for congestion, sinus pressure and sore throat. Negative for postnasal drip, rhinorrhea and sinus pain.   Respiratory: Positive for cough. Negative for shortness of breath and wheezing.   Cardiovascular: Negative for chest pain.      Objective:   Physical Exam  Constitutional: He is oriented to person, place, and time. He appears well-developed and well-nourished.  HENT:  Head: Normocephalic and atraumatic.  Right Ear: Tympanic membrane, external ear and ear canal normal.  Left Ear: Tympanic membrane, external ear and ear canal normal.  Nose: No rhinorrhea.  Mouth/Throat: Oropharynx is clear and moist and mucous membranes are normal. No oropharyngeal exudate or posterior oropharyngeal erythema.  Eyes: Conjunctivae are normal. Pupils are equal, round, and reactive to light.  Neck: Neck supple.  Cardiovascular: Normal rate, regular rhythm, normal heart sounds and intact distal pulses.   No murmur heard. Pulmonary/Chest: Effort normal and breath sounds normal. No respiratory distress. He has no wheezes. He has no rhonchi. He has no rales.  Abdominal: Soft. There is no tenderness.  Lymphadenopathy:    He has no cervical adenopathy.  Neurological: He is alert and oriented to person, place, and time.  Skin: Skin is warm and dry. No rash noted.  Psychiatric: He has a normal mood and affect. His behavior is normal.  Vitals reviewed.   Vitals:   04/28/16 1343  BP: 124/80  Pulse: (!) 110  Resp: 18  Temp: 98.2 F (36.8 C)  TempSrc: Oral  SpO2: 98%  Weight: 240 lb (108.9 kg)  Height: 5\' 8"  (1.727 m)      Assessment & Plan:    Bryan Barber is a 45 y.o. male Mild intermittent asthmatic bronchitis without complication - Plan: azithromycin (ZITHROMAX) 250 MG tablet,  predniSONE (DELTASONE) 20 MG tablet  Cough - Plan: azithromycin (ZITHROMAX) 250 MG tablet, guaiFENesin-codeine 100-10 MG/5ML syrup  Early asthmatic bronchitis, overall reassuring exam and o2 sat. Slight heart rate elevation may be due to albuterol use.  -Symptomatic care discussed, continue albuterol inhaler up to every 4-6 hours, but in 2-3 days if persistent frequent use, start prednisone. Side effects discussed  -If discolored mucus/lower respiratory symptoms not improving within 2-3 days, can start azithromycin to cover for atypicals  -Agreed to prescribe codeine cough syrup, but it advised to not use that if wheezing or short of breath, and ideally should use it only at bedtime if needed. RTC precautions discussed.  Meds ordered this encounter  Medications  . azithromycin (ZITHROMAX) 250 MG tablet    Sig: Take 2 pills by mouth on day 1, then 1  pill by mouth per day on days 2 through 5.    Dispense:  6 tablet    Refill:  0  . guaiFENesin-codeine 100-10 MG/5ML syrup    Sig: Take 5 mLs by mouth at bedtime as needed for cough.    Dispense:  120 mL    Refill:  0  . predniSONE (DELTASONE) 20 MG tablet    Sig: Take 1 tablet (20 mg total) by mouth daily with breakfast.    Dispense:  6 tablet    Refill:  0   Patient Instructions   As your symptoms are slightly improved, this is likely a viral asthmatic bronchitis or viral infection that is triggering your asthma. Okay to continue the albuterol up to every 4-6 hours if needed for wheezing or shortness of breath. If you do require that albuterol frequently in the next 3 days, can start the prednisone as prescribed. If your cough is not improving in the next 2-3 days, I did also prescribe antibiotic azithromycin. If you do require a cough syrup at night, did write for the codeine cough syrup, but that should only be taken if you're not wheezing or short of breath. If you are wheezing, use albuterol, NOT the codeine cough syrup.   Upper  Respiratory Infection, Adult Most upper respiratory infections (URIs) are a viral infection of the air passages leading to the lungs. A URI affects the nose, throat, and upper air passages. The most common type of URI is nasopharyngitis and is typically referred to as "the common cold." URIs run their course and usually go away on their own. Most of the time, a URI does not require medical attention, but sometimes a bacterial infection in the upper airways can follow a viral infection. This is called a secondary infection. Sinus and middle ear infections are common types of secondary upper respiratory infections. Bacterial pneumonia can also complicate a URI. A URI can worsen asthma and chronic obstructive pulmonary disease (COPD). Sometimes, these complications can require emergency medical care and may be life threatening. What are the causes? Almost all URIs are caused by viruses. A virus is a type of germ and can spread from one person to another. What increases the risk? You may be at risk for a URI if:  You smoke.  You have chronic heart or lung disease.  You have a weakened defense (immune) system.  You are very young or very old.  You have nasal allergies or asthma.  You work in crowded or poorly ventilated areas.  You work in health care facilities or schools. What are the signs or symptoms? Symptoms typically develop 2-3 days after you come in contact with a cold virus. Most viral URIs last 7-10 days. However, viral URIs from the influenza virus (flu virus) can last 14-18 days and are typically more severe. Symptoms may include:  Runny or stuffy (congested) nose.  Sneezing.  Cough.  Sore throat.  Headache.  Fatigue.  Fever.  Loss of appetite.  Pain in your forehead, behind your eyes, and over your cheekbones (sinus pain).  Muscle aches. How is this diagnosed? Your health care provider may diagnose a URI by:  Physical exam.  Tests to check that your symptoms  are not due to another condition such as:  Strep throat.  Sinusitis.  Pneumonia.  Asthma. How is this treated? A URI goes away on its own with time. It cannot be cured with medicines, but medicines may be prescribed or recommended to relieve symptoms. Medicines may  help:  Reduce your fever.  Reduce your cough.  Relieve nasal congestion. Follow these instructions at home:  Take medicines only as directed by your health care provider.  Gargle warm saltwater or take cough drops to comfort your throat as directed by your health care provider.  Use a warm mist humidifier or inhale steam from a shower to increase air moisture. This may make it easier to breathe.  Drink enough fluid to keep your urine clear or pale yellow.  Eat soups and other clear broths and maintain good nutrition.  Rest as needed.  Return to work when your temperature has returned to normal or as your health care provider advises. You may need to stay home longer to avoid infecting others. You can also use a face mask and careful hand washing to prevent spread of the virus.  Increase the usage of your inhaler if you have asthma.  Do not use any tobacco products, including cigarettes, chewing tobacco, or electronic cigarettes. If you need help quitting, ask your health care provider. How is this prevented? The best way to protect yourself from getting a cold is to practice good hygiene.  Avoid oral or hand contact with people with cold symptoms.  Wash your hands often if contact occurs. There is no clear evidence that vitamin C, vitamin E, echinacea, or exercise reduces the chance of developing a cold. However, it is always recommended to get plenty of rest, exercise, and practice good nutrition. Contact a health care provider if:  You are getting worse rather than better.  Your symptoms are not controlled by medicine.  You have chills.  You have worsening shortness of breath.  You have brown or red  mucus.  You have yellow or brown nasal discharge.  You have pain in your face, especially when you bend forward.  You have a fever.  You have swollen neck glands.  You have pain while swallowing.  You have white areas in the back of your throat. Get help right away if:  You have severe or persistent:  Headache.  Ear pain.  Sinus pain.  Chest pain.  You have chronic lung disease and any of the following:  Wheezing.  Prolonged cough.  Coughing up blood.  A change in your usual mucus.  You have a stiff neck.  You have changes in your:  Vision.  Hearing.  Thinking.  Mood. This information is not intended to replace advice given to you by your health care provider. Make sure you discuss any questions you have with your health care provider. Document Released: 11/14/2000 Document Revised: 01/22/2016 Document Reviewed: 08/26/2013 Elsevier Interactive Patient Education  2017 [place].  Asthma, Acute Bronchospasm Acute bronchospasm caused by asthma is also referred to as an asthma attack. Bronchospasm means your air passages become narrowed. The narrowing is caused by inflammation and tightening of the muscles in the air tubes (bronchi) in your lungs. This can make it hard to breathe or cause you to wheeze and cough. What are the causes? Possible triggers are:  Animal dander from the skin, hair, or feathers of animals.  Dust mites contained in house dust.  Cockroaches.  Pollen from trees or grass.  Mold.  Cigarette or tobacco smoke.  Air pollutants such as dust, household cleaners, hair sprays, aerosol sprays, paint fumes, strong chemicals, or strong odors.  Cold air or weather changes. Cold air may trigger inflammation. Winds increase molds and pollens in the air.  Strong emotions such as crying or laughing hard.  Stress.  Certain medicines such as aspirin or beta-blockers.  Sulfites in foods and drinks, such as dried fruits and  wine.  Infections or inflammatory conditions, such as a flu, cold, or inflammation of the nasal membranes (rhinitis).  Gastroesophageal reflux disease (GERD). GERD is a condition where stomach acid backs up into your esophagus.  Exercise or strenuous activity. What are the signs or symptoms?  Wheezing.  Excessive coughing, particularly at night.  Chest tightness.  Shortness of breath. How is this diagnosed? Your health care provider will ask you about your medical history and perform a physical exam. A chest X-ray or blood testing may be performed to look for other causes of your symptoms or other conditions that may have triggered your asthma attack. How is this treated? Treatment is aimed at reducing inflammation and opening up the airways in your lungs. Most asthma attacks are treated with inhaled medicines. These include quick relief or rescue medicines (such as bronchodilators) and controller medicines (such as inhaled corticosteroids). These medicines are sometimes given through an inhaler or a nebulizer. Systemic steroid medicine taken by mouth or given through an IV tube also can be used to reduce the inflammation when an attack is moderate or severe. Antibiotic medicines are only used if a bacterial infection is present. Follow these instructions at home:  Rest.  Drink plenty of liquids. This helps the mucus to remain thin and be easily coughed up. Only use caffeine in moderation and do not use alcohol until you have recovered from your illness.  Do not smoke. Avoid being exposed to secondhand smoke.  You play a critical role in keeping yourself in good health. Avoid exposure to things that cause you to wheeze or to have breathing problems.  Keep your medicines up-to-date and available. Carefully follow your health care provider's treatment plan.  Take your medicine exactly as prescribed.  When pollen or pollution is bad, keep windows closed and use an air conditioner or go  to places with air conditioning.  Asthma requires careful medical care. See your health care provider for a follow-up as advised. If you are more than [redacted] weeks pregnant and you were prescribed any new medicines, let your obstetrician know about the visit and how you are doing. Follow up with your health care provider as directed.  After you have recovered from your asthma attack, make an appointment with your outpatient doctor to talk about ways to reduce the likelihood of future attacks. If you do not have a doctor who manages your asthma, make an appointment with a primary care doctor to discuss your asthma. Get help right away if:  You are getting worse.  You have trouble breathing. If severe, call your local emergency services (911 in the [place]  You develop chest pain or discomfort.  You are vomiting.  You are not able to keep fluids down.  You are coughing up yellow, green, brown, or bloody sputum.  You have a fever and your symptoms suddenly get worse.  You have trouble swallowing. This information is not intended to replace advice given to you by your health care provider. Make sure you discuss any questions you have with your health care provider. Document Released: 09/05/2006 Document Revised: 11/02/2015 Document Reviewed: 11/26/2012 Elsevier Interactive Patient Education  2017 [employer].    IF you received an x-ray today, you will receive an invoice from [facility]. Please contact [facility] at [phone] with questions or concerns regarding your invoice.   IF you received labwork  today, you will receive an invoice from [employer]. Please contact [facility] at [phone] with questions or concerns regarding your invoice.   Our billing staff will not be able to assist you with questions regarding bills from these companies.  You will be contacted with the lab results as soon as they are available. The fastest way to get  your results is to activate your My Chart account. Instructions are located on the last page of this paperwork. If you have not heard from [place] regarding the results in 2 weeks, please contact this office.       I personally performed the services described in this documentation, which was scribed in my presence. The recorded information has been reviewed and considered, and addended by me as needed.   Signed,   Merri Ray, MD [facility].  04/29/16 11:26 PM

## 2016-04-28 NOTE — Patient Instructions (Addendum)
As your symptoms are slightly improved, this is likely a viral asthmatic bronchitis or viral infection that is triggering your asthma. Okay to continue the albuterol up to every 4-6 hours if needed for wheezing or shortness of breath. If you do require that albuterol frequently in the next 3 days, can start the prednisone as prescribed. If your cough is not improving in the next 2-3 days, I did also prescribe antibiotic azithromycin. If you do require a cough syrup at night, did write for the codeine cough syrup, but that should only be taken if you're not wheezing or short of breath. If you are wheezing, use albuterol, NOT the codeine cough syrup.   Upper Respiratory Infection, Adult Most upper respiratory infections (URIs) are a viral infection of the air passages leading to the lungs. A URI affects the nose, throat, and upper air passages. The most common type of URI is nasopharyngitis and is typically referred to as "the common cold." URIs run their course and usually go away on their own. Most of the time, a URI does not require medical attention, but sometimes a bacterial infection in the upper airways can follow a viral infection. This is called a secondary infection. Sinus and middle ear infections are common types of secondary upper respiratory infections. Bacterial pneumonia can also complicate a URI. A URI can worsen asthma and chronic obstructive pulmonary disease (COPD). Sometimes, these complications can require emergency medical care and may be life threatening. What are the causes? Almost all URIs are caused by viruses. A virus is a type of germ and can spread from one person to another. What increases the risk? You may be at risk for a URI if:  You smoke.  You have chronic heart or lung disease.  You have a weakened defense (immune) system.  You are very young or very old.  You have nasal allergies or asthma.  You work in crowded or poorly ventilated areas.  You work in  health care facilities or schools. What are the signs or symptoms? Symptoms typically develop 2-3 days after you come in contact with a cold virus. Most viral URIs last 7-10 days. However, viral URIs from the influenza virus (flu virus) can last 14-18 days and are typically more severe. Symptoms may include:  Runny or stuffy (congested) nose.  Sneezing.  Cough.  Sore throat.  Headache.  Fatigue.  Fever.  Loss of appetite.  Pain in your forehead, behind your eyes, and over your cheekbones (sinus pain).  Muscle aches. How is this diagnosed? Your health care provider may diagnose a URI by:  Physical exam.  Tests to check that your symptoms are not due to another condition such as:  Strep throat.  Sinusitis.  Pneumonia.  Asthma. How is this treated? A URI goes away on its own with time. It cannot be cured with medicines, but medicines may be prescribed or recommended to relieve symptoms. Medicines may help:  Reduce your fever.  Reduce your cough.  Relieve nasal congestion. Follow these instructions at home:  Take medicines only as directed by your health care provider.  Gargle warm saltwater or take cough drops to comfort your throat as directed by your health care provider.  Use a warm mist humidifier or inhale steam from a shower to increase air moisture. This may make it easier to breathe.  Drink enough fluid to keep your urine clear or pale yellow.  Eat soups and other clear broths and maintain good nutrition.  Rest as needed.  Return to work when your temperature has returned to normal or as your health care provider advises. You may need to stay home longer to avoid infecting others. You can also use a face mask and careful hand washing to prevent spread of the virus.  Increase the usage of your inhaler if you have asthma.  Do not use any tobacco products, including cigarettes, chewing tobacco, or electronic cigarettes. If you need help quitting, ask  your health care provider. How is this prevented? The best way to protect yourself from getting a cold is to practice good hygiene.  Avoid oral or hand contact with people with cold symptoms.  Wash your hands often if contact occurs. There is no clear evidence that vitamin C, vitamin E, echinacea, or exercise reduces the chance of developing a cold. However, it is always recommended to get plenty of rest, exercise, and practice good nutrition. Contact a health care provider if:  You are getting worse rather than better.  Your symptoms are not controlled by medicine.  You have chills.  You have worsening shortness of breath.  You have brown or red mucus.  You have yellow or brown nasal discharge.  You have pain in your face, especially when you bend forward.  You have a fever.  You have swollen neck glands.  You have pain while swallowing.  You have white areas in the back of your throat. Get help right away if:  You have severe or persistent:  Headache.  Ear pain.  Sinus pain.  Chest pain.  You have chronic lung disease and any of the following:  Wheezing.  Prolonged cough.  Coughing up blood.  A change in your usual mucus.  You have a stiff neck.  You have changes in your:  Vision.  Hearing.  Thinking.  Mood. This information is not intended to replace advice given to you by your health care provider. Make sure you discuss any questions you have with your health care provider. Document Released: 11/14/2000 Document Revised: 01/22/2016 Document Reviewed: 08/26/2013 Elsevier Interactive Patient Education  2017 [employer].  Asthma, Acute Bronchospasm Acute bronchospasm caused by asthma is also referred to as an asthma attack. Bronchospasm means your air passages become narrowed. The narrowing is caused by inflammation and tightening of the muscles in the air tubes (bronchi) in your lungs. This can make it hard to breathe or cause you to wheeze and  cough. What are the causes? Possible triggers are:  Animal dander from the skin, hair, or feathers of animals.  Dust mites contained in house dust.  Cockroaches.  Pollen from trees or grass.  Mold.  Cigarette or tobacco smoke.  Air pollutants such as dust, household cleaners, hair sprays, aerosol sprays, paint fumes, strong chemicals, or strong odors.  Cold air or weather changes. Cold air may trigger inflammation. Winds increase molds and pollens in the air.  Strong emotions such as crying or laughing hard.  Stress.  Certain medicines such as aspirin or beta-blockers.  Sulfites in foods and drinks, such as dried fruits and wine.  Infections or inflammatory conditions, such as a flu, cold, or inflammation of the nasal membranes (rhinitis).  Gastroesophageal reflux disease (GERD). GERD is a condition where stomach acid backs up into your esophagus.  Exercise or strenuous activity. What are the signs or symptoms?  Wheezing.  Excessive coughing, particularly at night.  Chest tightness.  Shortness of breath. How is this diagnosed? Your health care provider will ask you about your medical history and  perform a physical exam. A chest X-ray or blood testing may be performed to look for other causes of your symptoms or other conditions that may have triggered your asthma attack. How is this treated? Treatment is aimed at reducing inflammation and opening up the airways in your lungs. Most asthma attacks are treated with inhaled medicines. These include quick relief or rescue medicines (such as bronchodilators) and controller medicines (such as inhaled corticosteroids). These medicines are sometimes given through an inhaler or a nebulizer. Systemic steroid medicine taken by mouth or given through an IV tube also can be used to reduce the inflammation when an attack is moderate or severe. Antibiotic medicines are only used if a bacterial infection is present. Follow these  instructions at home:  Rest.  Drink plenty of liquids. This helps the mucus to remain thin and be easily coughed up. Only use caffeine in moderation and do not use alcohol until you have recovered from your illness.  Do not smoke. Avoid being exposed to secondhand smoke.  You play a critical role in keeping yourself in good health. Avoid exposure to things that cause you to wheeze or to have breathing problems.  Keep your medicines up-to-date and available. Carefully follow your health care provider's treatment plan.  Take your medicine exactly as prescribed.  When pollen or pollution is bad, keep windows closed and use an air conditioner or go to places with air conditioning.  Asthma requires careful medical care. See your health care provider for a follow-up as advised. If you are more than [redacted] weeks pregnant and you were prescribed any new medicines, let your obstetrician know about the visit and how you are doing. Follow up with your health care provider as directed.  After you have recovered from your asthma attack, make an appointment with your outpatient doctor to talk about ways to reduce the likelihood of future attacks. If you do not have a doctor who manages your asthma, make an appointment with a primary care doctor to discuss your asthma. Get help right away if:  You are getting worse.  You have trouble breathing. If severe, call your local emergency services (911 in the [place]  You develop chest pain or discomfort.  You are vomiting.  You are not able to keep fluids down.  You are coughing up yellow, green, brown, or bloody sputum.  You have a fever and your symptoms suddenly get worse.  You have trouble swallowing. This information is not intended to replace advice given to you by your health care provider. Make sure you discuss any questions you have with your health care provider. Document Released: 09/05/2006 Document Revised: 11/02/2015 Document Reviewed:  11/26/2012 Elsevier Interactive Patient Education  2017 [employer].    IF you received an x-ray today, you will receive an invoice from [facility]. Please contact [facility] at [phone] with questions or concerns regarding your invoice.   IF you received labwork today, you will receive an invoice from [employer]. Please contact [facility] at [phone] with questions or concerns regarding your invoice.   Our billing staff will not be able to assist you with questions regarding bills from these companies.  You will be contacted with the lab results as soon as they are available. The fastest way to get your results is to activate your My Chart account. Instructions are located on the last page of this paperwork. If you have not heard from [place] regarding the results in 2 weeks, please contact this  office.

## 2016-05-15 ENCOUNTER — Encounter: Payer: Self-pay | Admitting: Nurse Practitioner

## 2016-05-15 ENCOUNTER — Ambulatory Visit (INDEPENDENT_AMBULATORY_CARE_PROVIDER_SITE_OTHER): Payer: Managed Care, Other (non HMO) | Admitting: Nurse Practitioner

## 2016-05-15 ENCOUNTER — Ambulatory Visit
Admission: RE | Admit: 2016-05-15 | Discharge: 2016-05-15 | Disposition: A | Discharge status: Home / Self Care | Payer: Managed Care, Other (non HMO) | Source: Ambulatory Visit | Attending: Nurse Practitioner | Admitting: Nurse Practitioner

## 2016-05-15 ENCOUNTER — Telehealth: Payer: Self-pay

## 2016-05-15 VITALS — BP 122/84 | HR 93 | Temp 97.5°F | Ht 69.0 in | Wt 243.0 lb

## 2016-05-15 DIAGNOSIS — R509 Fever, unspecified: Secondary | ICD-10-CM

## 2016-05-15 DIAGNOSIS — R0602 Shortness of breath: Secondary | ICD-10-CM

## 2016-05-15 DIAGNOSIS — J209 Acute bronchitis, unspecified: Secondary | ICD-10-CM | POA: Diagnosis not present

## 2016-05-15 MED ORDER — BENZONATATE 100 MG PO CAPS: 100.0000 mg | ORAL_CAPSULE | Freq: Three times a day (TID) | ORAL | 0 refills | Status: DC | PRN

## 2016-05-15 MED ORDER — BENZONATATE 100 MG PO CAPS
100.0000 mg | ORAL_CAPSULE | Freq: Three times a day (TID) | ORAL | 0 refills | Status: DC | PRN
Start: 2016-05-15 — End: 2016-08-23

## 2016-05-15 MED ORDER — IPRATROPIUM-ALBUTEROL 0.5-2.5 (3) MG/3ML IN SOLN
3.0000 mL | Freq: Once | RESPIRATORY_TRACT | Status: AC
  Administered 2016-05-15: 3 mL via RESPIRATORY_TRACT

## 2016-05-15 MED ORDER — METHYLPREDNISOLONE ACETATE 80 MG/ML IJ SUSP
80.0000 mg | Freq: Once | INTRAMUSCULAR | Status: AC
  Administered 2016-05-15: 80 mg via INTRAMUSCULAR

## 2016-05-15 NOTE — Patient Instructions (Addendum)
Please use inhalers as prescribed. Declined promethazine DM prescription  Go to basement for CXR. You will be called with results.  Encourage adequate oral hydration.

## 2016-05-15 NOTE — Progress Notes (Signed)
Subjective:  Patient ID: Bryan Barber, male    DOB: 10-08-1970  Age: 45 y.o. MRN: [id number]  CC: Cough (very bad coughing slight yellow,chills,sweat at night,bodyache for 3 wks. took zpak,prenison and levaquin. req refill for Valium)   Cough  This is a new problem. The current episode started 1 to 4 weeks ago. The problem has been gradually worsening. The cough is productive of purulent sputum. Associated symptoms include chest pain, chills, a fever, myalgias, nasal congestion, postnasal drip, rhinorrhea, a sore throat, shortness of breath, sweats and wheezing. The symptoms are aggravated by lying down. He has tried OTC cough suppressant, a beta-agonist inhaler, prescription cough suppressant, rest and steroid inhaler for the symptoms. The treatment provided no relief. His past medical history is significant for asthma, bronchitis and environmental allergies.  Hycodan cough syrup prescribed 05/12/16 x 6days.  Outpatient Medications Prior to Visit  Medication Sig Dispense Refill  . albuterol (PROAIR HFA) 108 (90 Base) MCG/ACT inhaler Inhale 2 puffs into the lungs every 6 (six) hours as needed. 3 Inhaler 1  . amLODipine (NORVASC) 5 MG tablet TAKE 1 TABLET (5 MG TOTAL) BY MOUTH DAILY. 90 tablet 0  . diazepam (VALIUM) 5 MG tablet Take 1-2 tablets (5-10 mg total) by mouth at bedtime as needed for muscle spasms. 60 tablet 0  . modafinil (PROVIGIL) 200 MG tablet Take 2 tablets (400 mg total) by mouth daily. 180 tablet 0  . mometasone-formoterol (DULERA) 200-5 MCG/ACT AERO USE 2 INHALATIONS ORALLY   INTO THE LUNGS DAILY 13 g 11  . FLUoxetine (PROZAC) 40 MG capsule Take 1 capsule (40 mg total) by mouth daily. (Patient not taking: Reported on 05/15/2016) 90 capsule 0  . guaiFENesin-codeine 100-10 MG/5ML syrup Take 5 mLs by mouth at bedtime as needed for cough. (Patient not taking: Reported on 05/15/2016) 120 mL 0  . azithromycin (ZITHROMAX) 250 MG tablet Take 2 pills by mouth on day 1, then 1 pill by  mouth per day on days 2 through 5. (Patient not taking: Reported on 05/15/2016) 6 tablet 0  . predniSONE (DELTASONE) 20 MG tablet Take 1 tablet (20 mg total) by mouth daily with breakfast. (Patient not taking: Reported on 05/15/2016) 6 tablet 0   No facility-administered medications prior to visit.     ROS See HPI  Objective:  BP 122/84   Pulse 93   Temp 97.5 F (36.4 C)   Ht 5\' 9"  (1.753 m)   Wt 243 lb (110.2 kg)   SpO2 98%   BMI 35.88 kg/m   BP Readings from Last 3 Encounters:  05/15/16 122/84  04/28/16 124/80  01/25/16 132/82    Wt Readings from Last 3 Encounters:  05/15/16 243 lb (110.2 kg)  04/28/16 240 lb (108.9 kg)  01/25/16 233 lb (105.7 kg)    Physical Exam  Constitutional: He is oriented to person, place, and time.  Neck: Normal range of motion. Neck supple.  Cardiovascular: Normal rate and normal heart sounds.   Pulmonary/Chest: Effort normal. He has wheezes. He has rales.  Musculoskeletal: He exhibits no edema.  Lymphadenopathy:    He has no cervical adenopathy.  Neurological: He is alert and oriented to person, place, and time.  Skin: Skin is warm and dry.  Vitals reviewed.   Lab Results  Component Value Date   WBC 7.1 09/02/2015   HGB 16.6 09/02/2015   HCT 48.4 09/02/2015   PLT 187 09/02/2015   GLUCOSE 108 (H) 09/02/2015   CHOL 207 (HH) 08/08/2006  TRIG 84 08/08/2006   HDL 39.9 08/08/2006   LDLDIRECT 150.1 08/08/2006   ALT 29 08/11/2014   AST 18 08/11/2014   NA 139 09/02/2015   K 4.2 09/02/2015   CL 104 09/02/2015   CREATININE 1.09 09/02/2015   BUN 11 09/02/2015   CO2 25 09/02/2015   TSH 1.57 08/11/2014   HGBA1C 5.8 03/10/2012    Dg Chest 2 View  Result Date: 09/02/2015 CLINICAL DATA:  High blood pressure for 2-3 weeks.  Chest pain. EXAM: CHEST  2 VIEW COMPARISON:  07/04/2010 FINDINGS: Heart and mediastinal contours are within normal limits. No focal opacities or effusions. No acute bony abnormality. IMPRESSION: No active  cardiopulmonary disease. Electronically Signed   By: Rolm Baptise M.D.   On: 09/02/2015 13:37    Assessment & Plan:   [name] was seen today for cough.  Diagnoses and all orders for this visit:  Acute bronchitis with bronchospasm -     ipratropium-albuterol (DUONEB) 0.5-2.5 (3) MG/3ML nebulizer solution 3 mL; Take 3 mLs by nebulization once. -     DG Chest 2 View; Future -     methylPREDNISolone acetate (DEPO-MEDROL) injection 80 mg; Inject 1 mL (80 mg total) into the muscle once. -     Discontinue: benzonatate (TESSALON) 100 MG capsule; Take 1 capsule (100 mg total) by mouth 3 (three) times daily as needed for cough. -     benzonatate (TESSALON) 100 MG capsule; Take 1 capsule (100 mg total) by mouth 3 (three) times daily as needed for cough.  SOB (shortness of breath) on exertion -     ipratropium-albuterol (DUONEB) 0.5-2.5 (3) MG/3ML nebulizer solution 3 mL; Take 3 mLs by nebulization once. -     DG Chest 2 View; Future  Fever and chills -     ipratropium-albuterol (DUONEB) 0.5-2.5 (3) MG/3ML nebulizer solution 3 mL; Take 3 mLs by nebulization once. -     DG Chest 2 View; Future   I have discontinued Mr. Stanczak azithromycin and predniSONE. I am also having him maintain his albuterol, FLUoxetine, mometasone-formoterol, diazepam, modafinil, amLODipine, guaiFENesin-codeine, FLUoxetine, levofloxacin, and benzonatate. We administered ipratropium-albuterol and methylPREDNISolone acetate.  Meds ordered this encounter  Medications  . FLUoxetine (PROZAC) 10 MG capsule  . levofloxacin (LEVAQUIN) 750 MG tablet    Sig: Take by mouth.  Marland Kitchen ipratropium-albuterol (DUONEB) 0.5-2.5 (3) MG/3ML nebulizer solution 3 mL  . methylPREDNISolone acetate (DEPO-MEDROL) injection 80 mg  . DISCONTD: benzonatate (TESSALON) 100 MG capsule    Sig: Take 1 capsule (100 mg total) by mouth 3 (three) times daily as needed for cough.    Dispense:  20 capsule    Refill:  0    Order Specific Question:   Supervising  Provider    Answer:   Cassandria Anger [[id number]]  . benzonatate (TESSALON) 100 MG capsule    Sig: Take 1 capsule (100 mg total) by mouth 3 (three) times daily as needed for cough.    Dispense:  20 capsule    Refill:  0    Order Specific Question:   Supervising Provider    Answer:   Cassandria Anger [[id number]]    Follow-up: No Follow-up on file.  Wilfred Lacy, NP

## 2016-05-15 NOTE — Progress Notes (Signed)
Pre visit review using our clinic review tool, if applicable. No additional management support is needed unless otherwise documented below in the visit note. 

## 2016-05-15 NOTE — Telephone Encounter (Signed)
Pt is requesting refill of valium. Last refill was 12/05/15

## 2016-05-16 ENCOUNTER — Other Ambulatory Visit: Payer: Self-pay | Admitting: Family

## 2016-05-16 ENCOUNTER — Other Ambulatory Visit: Payer: Self-pay | Admitting: Nurse Practitioner

## 2016-05-16 ENCOUNTER — Ambulatory Visit (INDEPENDENT_AMBULATORY_CARE_PROVIDER_SITE_OTHER)
Admission: RE | Admit: 2016-05-16 | Discharge: 2016-05-16 | Disposition: A | Discharge status: Home / Self Care | Payer: Managed Care, Other (non HMO) | Source: Ambulatory Visit | Attending: Nurse Practitioner | Admitting: Nurse Practitioner

## 2016-05-16 DIAGNOSIS — J209 Acute bronchitis, unspecified: Secondary | ICD-10-CM

## 2016-05-16 DIAGNOSIS — R509 Fever, unspecified: Secondary | ICD-10-CM | POA: Diagnosis not present

## 2016-05-16 DIAGNOSIS — R0602 Shortness of breath: Secondary | ICD-10-CM

## 2016-05-16 NOTE — Progress Notes (Signed)
Normal results, see office note

## 2016-05-17 MED ORDER — DIAZEPAM 5 MG PO TABS: 5.0000 mg | ORAL_TABLET | Freq: Every evening | ORAL | 0 refills | Status: DC | PRN

## 2016-05-17 NOTE — Telephone Encounter (Signed)
Done

## 2016-05-17 NOTE — Telephone Encounter (Signed)
Faxed

## 2016-06-23 ENCOUNTER — Other Ambulatory Visit: Payer: Self-pay | Admitting: Family

## 2016-06-23 ENCOUNTER — Other Ambulatory Visit: Payer: Self-pay | Admitting: Pulmonary Disease

## 2016-06-25 NOTE — Telephone Encounter (Signed)
Patient was last seen on 01/25/2016 with you. Patient was told to follow up in 2 months, but did not. Last RX for this medication was on 03/12/2016.   Dr. Halford Chessman, is it ok to refill this medication and advise patient he needs a follow up? Thanks.

## 2016-06-26 ENCOUNTER — Other Ambulatory Visit: Payer: Self-pay | Admitting: Pulmonary Disease

## 2016-06-26 ENCOUNTER — Telehealth: Payer: Self-pay | Admitting: Pulmonary Disease

## 2016-06-26 NOTE — Telephone Encounter (Signed)
Pt requesting refill on provigil to be sent to [facility] on [address].   Pt has enough medication to last through tomorrow.  Last refill: 03/12/2016 #180 with 0 refills, take 2 tabs po qd.  VS ok to refill?  Thanks

## 2016-06-27 MED ORDER — MODAFINIL 200 MG PO TABS: 400.0000 mg | ORAL_TABLET | Freq: Every day | ORAL | 0 refills | Status: DC

## 2016-06-27 NOTE — Telephone Encounter (Signed)
rx called in to pharmacy.   Pt aware.  Attempted to schedule rov, pt states he was not available to schedule rov at this time and would call back.  I advised pt that he would not be able to receive further refills on this medication until he had a rov. Pt expressed understanding.  Will await pt's call back to schedule appt.

## 2016-06-27 NOTE — Telephone Encounter (Signed)
He was last seen in August 2017 and was to have f/u in 2 months.  Okay to send 1 month supply with no refills, but he needs to have ROV with me or NP scheduled.

## 2016-07-23 ENCOUNTER — Ambulatory Visit: Payer: Managed Care, Other (non HMO) | Admitting: Adult Health

## 2016-07-30 ENCOUNTER — Encounter: Payer: Self-pay | Admitting: Adult Health

## 2016-07-30 ENCOUNTER — Other Ambulatory Visit: Payer: Self-pay | Admitting: Pulmonary Disease

## 2016-07-30 ENCOUNTER — Ambulatory Visit (INDEPENDENT_AMBULATORY_CARE_PROVIDER_SITE_OTHER): Payer: Managed Care, Other (non HMO) | Admitting: Adult Health

## 2016-07-30 DIAGNOSIS — G4730 Sleep apnea, unspecified: Secondary | ICD-10-CM

## 2016-07-30 DIAGNOSIS — G4710 Hypersomnia, unspecified: Secondary | ICD-10-CM

## 2016-07-30 DIAGNOSIS — G4733 Obstructive sleep apnea (adult) (pediatric): Secondary | ICD-10-CM | POA: Diagnosis not present

## 2016-07-30 NOTE — Progress Notes (Signed)
@Patient  ID: Bryan Barber, male    DOB: [birth year], 46 y.o.   MRN: [id number]  No chief complaint on file.   Referring provider: Golden Circle, FNP  HPI: 46 yo male seen for sleep consult for severe OSA on CPAP (since 2006)   TEST  Tests: PSG 04/13/05 >> AHI 60  07/30/2016 Follow up : OSA /hypersomnia  Patient presents for a 6 month follow up . Seen last ov to establish for OSA  He says he is doing well on CPAP . Says he can not sleep without it.  Download shows excellent compliance with average usage at 8.5 hours. AHI is 1.5. His machine is getting old. And humidification has having trouble. He would like to see if he can get a new machine.  Does have daytime sleepiness that he takes Nuvigil . This really helps him.  He is on Provigil  400mg  daily .  He needs refills.     Allergies  Allergen Reactions  . Aspirin     Face swelling     Immunization History  Administered Date(s) Administered  . Influenza Split 03/10/2012    Past Medical History:  Diagnosis Date  . Allergic rhinitis   . Asthma   . EE (eosinophilic esophagitis) 99991111  . GERD (gastroesophageal reflux disease) 08/20/2011  . HLD (hyperlipidemia)   . Hypertension   . Multiple gastric polyps 08/20/2011  . Obesity   . OSA on CPAP     Tobacco History: History  Smoking Status  . Former Smoker  . Packs/day: 1.00  . Years: 10.00  . Types: Cigarettes  . Quit date: 06/05/1995  Smokeless Tobacco  . Never Used   Counseling given: Not Answered   Outpatient Encounter Prescriptions as of 07/30/2016  Medication Sig  . albuterol (PROAIR HFA) 108 (90 Base) MCG/ACT inhaler Inhale 2 puffs into the lungs every 6 (six) hours as needed.  Marland Kitchen amLODipine (NORVASC) 5 MG tablet TAKE 1 TABLET (5 MG TOTAL) BY MOUTH DAILY.  Marland Kitchen desvenlafaxine (PRISTIQ) 100 MG 24 hr tablet Take 100 mg by mouth daily.  . diazepam (VALIUM) 5 MG tablet Take 1-2 tablets (5-10 mg total) by mouth at bedtime as needed for muscle spasms.    Marland Kitchen FLUoxetine (PROZAC) 10 MG capsule Take 10 mg by mouth daily.   . modafinil (PROVIGIL) 200 MG tablet Take 2 tablets (400 mg total) by mouth daily.  . mometasone-formoterol (DULERA) 200-5 MCG/ACT AERO USE 2 INHALATIONS ORALLY   INTO THE LUNGS DAILY  . benzonatate (TESSALON) 100 MG capsule Take 1 capsule (100 mg total) by mouth 3 (three) times daily as needed for cough. (Patient not taking: Reported on 07/30/2016)  . [DISCONTINUED] FLUoxetine (PROZAC) 40 MG capsule Take 1 capsule (40 mg total) by mouth daily. (Patient not taking: Reported on 07/30/2016)  . [DISCONTINUED] guaiFENesin-codeine 100-10 MG/5ML syrup Take 5 mLs by mouth at bedtime as needed for cough. (Patient not taking: Reported on 07/30/2016)   No facility-administered encounter medications on file as of 07/30/2016.      Review of Systems  Constitutional:   No  weight loss, night sweats,  Fevers, chills, + fatigue, or  lassitude.  HEENT:   No headaches,  Difficulty swallowing,  Tooth/dental problems, or  Sore throat,                No sneezing, itching, ear ache, nasal congestion, post nasal drip,   CV:  No chest pain,  Orthopnea, PND, swelling in lower extremities, anasarca, dizziness, palpitations, syncope.  GI  No heartburn, indigestion, abdominal pain, nausea, vomiting, diarrhea, change in bowel habits, loss of appetite, bloody stools.   Resp: No shortness of breath with exertion or at rest.  No excess mucus, no productive cough,  No non-productive cough,  No coughing up of blood.  No change in color of mucus.  No wheezing.  No chest wall deformity  Skin: no rash or lesions.  GU: no dysuria, change in color of urine, no urgency or frequency.  No flank pain, no hematuria   MS:  No joint pain or swelling.  No decreased range of motion.  No back pain.    Physical Exam  BP 124/74 (BP Location: Left Arm, Cuff Size: Normal)   Pulse 98   Ht 5\' 7"  (1.702 m)   Wt 247 lb (112 kg)   SpO2 97%   BMI 38.69 kg/m   GEN:  A/Ox3; pleasant , NAD, obese    HEENT:  [place]/AT,  EACs-clear, TMs-wnl, NOSE-clear, THROAT-clear, no lesions, no postnasal drip or exudate noted. Class 2 MP airway  NECK:  Supple w/ fair ROM; no JVD; normal carotid impulses w/o bruits; no thyromegaly or nodules palpated; no lymphadenopathy.    RESP  Clear  P & A; w/o, wheezes/ rales/ or rhonchi. no accessory muscle use, no dullness to percussion  CARD:  RRR, no m/r/g, no peripheral edema, pulses intact, no cyanosis or clubbing.  GI:   Soft & nt; nml bowel sounds; no organomegaly or masses detected.   Musco: Warm bil, no deformities or joint swelling noted.   Neuro: alert, no focal deficits noted.    Skin: Warm, no lesions or rashes    Lab Results:  CBC    Component Value Date/Time   WBC 7.1 09/02/2015 1320   RBC 5.16 09/02/2015 1320   HGB 16.6 09/02/2015 1320   HCT 48.4 09/02/2015 1320   PLT 187 09/02/2015 1320   MCV 93.8 09/02/2015 1320   MCH 32.2 09/02/2015 1320   MCHC 34.3 09/02/2015 1320   RDW 14.2 09/02/2015 1320   LYMPHSABS 1.6 08/11/2014 0908   MONOABS 0.7 08/11/2014 0908   EOSABS 0.2 08/11/2014 0908   BASOSABS 0.0 08/11/2014 0908    BMET    Component Value Date/Time   NA 139 09/02/2015 1320   K 4.2 09/02/2015 1320   CL 104 09/02/2015 1320   CO2 25 09/02/2015 1320   GLUCOSE 108 (H) 09/02/2015 1320   BUN 11 09/02/2015 1320   CREATININE 1.09 09/02/2015 1320   CALCIUM 9.5 09/02/2015 1320   GFRNONAA >60 09/02/2015 1320   GFRAA >60 09/02/2015 1320    BNP No results found for: BNP  ProBNP No results found for: PROBNP  Imaging: No results found.   Assessment & Plan:   No problem-specific Assessment & Plan notes found for this encounter.     Rexene Edison, NP 07/30/2016

## 2016-07-30 NOTE — Patient Instructions (Signed)
Continue on C Pap at bedtime Order for new C Pap machine sent to DME Do not drive a sleepy Work on weight loss. Follow-up with Dr. Halford Chessman  in 1 year and as needed

## 2016-07-30 NOTE — Progress Notes (Signed)
I have reviewed and agree with assessment/plan.  Chesley Mires, MD [facility] Pulmonary/Critical Care 07/30/2016, 11:21 PM Pager:  [phone]

## 2016-07-30 NOTE — Assessment & Plan Note (Signed)
Cont on Provigil

## 2016-07-30 NOTE — Addendum Note (Signed)
Addended by: Parke Poisson E on: 07/30/2016 02:38 PM   Modules accepted: Orders

## 2016-07-30 NOTE — Assessment & Plan Note (Signed)
Doing well on CPAP   Plan  Patient Instructions  Continue on C Pap at bedtime Order for new C Pap machine sent to DME Do not drive a sleepy Work on weight loss. Follow-up with Dr. Halford Chessman  in 1 year and as needed

## 2016-07-31 NOTE — Telephone Encounter (Signed)
Patient requesting refill on Modafinil 200mg  Last refill: 06/27/16 #60 with 0 refills- take 2 tabs daily Last OV:  07/30/16 with TP, 01/25/16 with VS  VS ok to refill?  Thanks!

## 2016-08-01 MED ORDER — MODAFINIL 200 MG PO TABS: 400.0000 mg | ORAL_TABLET | Freq: Every day | ORAL | 0 refills | Status: DC

## 2016-08-01 NOTE — Addendum Note (Signed)
Addended by: Len Blalock on: 08/01/2016 09:29 AM   Modules accepted: Orders

## 2016-08-03 ENCOUNTER — Telehealth: Payer: Self-pay

## 2016-08-03 NOTE — Telephone Encounter (Signed)
PA request for Provigil received from [facility]. Initiated request through [email or web address]- Key: [id number] This has been approved. Called pharmacy, aware of approval.  Nothing further needed.

## 2016-08-12 ENCOUNTER — Other Ambulatory Visit: Payer: Self-pay | Admitting: Family

## 2016-08-13 NOTE — Telephone Encounter (Signed)
Last refill was 05/17/16

## 2016-08-13 NOTE — Telephone Encounter (Signed)
Faxed

## 2016-08-23 ENCOUNTER — Encounter: Payer: Self-pay | Admitting: Family

## 2016-08-23 ENCOUNTER — Ambulatory Visit (INDEPENDENT_AMBULATORY_CARE_PROVIDER_SITE_OTHER): Payer: 59 | Admitting: Family

## 2016-08-23 VITALS — BP 144/94 | HR 118 | Temp 98.2°F | Resp 16 | Ht 67.0 in | Wt 250.0 lb

## 2016-08-23 DIAGNOSIS — I10 Essential (primary) hypertension: Secondary | ICD-10-CM | POA: Diagnosis not present

## 2016-08-23 DIAGNOSIS — G5600 Carpal tunnel syndrome, unspecified upper limb: Secondary | ICD-10-CM | POA: Insufficient documentation

## 2016-08-23 DIAGNOSIS — G5602 Carpal tunnel syndrome, left upper limb: Secondary | ICD-10-CM

## 2016-08-23 MED ORDER — AMLODIPINE BESYLATE 10 MG PO TABS: 10.0000 mg | ORAL_TABLET | Freq: Every day | ORAL | 0 refills | Status: DC

## 2016-08-23 MED ORDER — MOMETASONE FURO-FORMOTEROL FUM 200-5 MCG/ACT IN AERO: INHALATION_SPRAY | RESPIRATORY_TRACT | 11 refills | Status: DC

## 2016-08-23 MED ORDER — IBUPROFEN-FAMOTIDINE 800-26.6 MG PO TABS: 1.0000 | ORAL_TABLET | Freq: Three times a day (TID) | ORAL | 1 refills | Status: DC | PRN

## 2016-08-23 MED ORDER — DICLOFENAC SODIUM 2 % TD SOLN: 1.0000 "application " | Freq: Two times a day (BID) | TRANSDERMAL | 1 refills | Status: DC | PRN

## 2016-08-23 NOTE — Patient Instructions (Addendum)
Thank you for choosing [facility]!  Ice / moist heat x 20 minutes every 2 hours and as needed or following activity  Consider a wrist splint as needed.  Increase your amlodipine to 10 mg daily.  Continue to monitor your blood pressure at home.  Decrease your Pristiq to 50 mg daily for the next 2 weeks and we can consider decreasing to 25 mg daily for an additional couple of weeks depending on symptoms.  Medications  Pennsaid - Approximately 1/2 packet to the affected site twice daily.  Duexis - 1 tablet 3 times per day for the next 5-7 days and then as needed.   You will receive a call from Daniel regarding your Pennsaid/Duexis/Vimovo. The medication will be mailed to you and should cost you no more than $10 per item or possibly free depending upon your insurance.   Your prescription(s) have been submitted to your pharmacy or been printed and provided for you. Please take as directed and contact our office if you believe you are having problem(s) with the medication(s) or have any questions.  If your symptoms worsen or fail to improve, please contact our office for further instruction, or in case of emergency go directly to the emergency room at the closest medical facility.    Carpal Tunnel Syndrome Carpal tunnel syndrome is a condition that causes pain in your hand and arm. The carpal tunnel is a narrow area that is on the palm side of your wrist. Repeated wrist motion or certain diseases may cause swelling in the tunnel. This swelling can pinch the main nerve in the wrist (median nerve). Follow these instructions at home: If you have a splint:   Wear it as told by your doctor. Remove it only as told by your doctor.  Loosen the splint if your fingers:  Become numb and tingle.  Turn blue and cold.  Keep the splint clean and dry. General instructions   Take over-the-counter and prescription medicines only as told by your doctor.  Rest your wrist from any  activity that may be causing your pain. If needed, talk to your employer about changes that can be made in your work, such as getting a wrist pad to use while typing.  If directed, apply ice to the painful area:  Put ice in a plastic bag.  Place a towel between your skin and the bag.  Leave the ice on for 20 minutes, 2-3 times per day.  Keep all follow-up visits as told by your doctor. This is important.  Do any exercises as told by your doctor, physical therapist, or occupational therapist. Contact a doctor if:  You have new symptoms.  Medicine does not help your pain.  Your symptoms get worse. This information is not intended to replace advice given to you by your health care provider. Make sure you discuss any questions you have with your health care provider. Document Released: 05/10/2011 Document Revised: 10/27/2015 Document Reviewed: 10/06/2014 Elsevier Interactive Patient Education  2017 [employer].

## 2016-08-23 NOTE — Assessment & Plan Note (Signed)
Blood pressures remained elevated above goal 140/90 with current medication regimen and no adverse side effects. Increase amlodipine. Encouraged to monitor blood pressure at home and follow low-sodium diet. Denies worst headache of life with no new symptoms of end organ damage noted on physical exam. Continue to monitor.

## 2016-08-23 NOTE — Progress Notes (Signed)
Subjective:    Patient ID: Bryan Barber, male    DOB: [birth year], 46 y.o.   MRN: [id number]  Chief Complaint  Patient presents with  . Numbness    left hand numbness x3 weeks     HPI:  Bryan Barber is a 46 y.o. male who  has a past medical history of Allergic rhinitis; Asthma; EE (eosinophilic esophagitis) (8/34/1962); GERD (gastroesophageal reflux disease) (08/20/2011); HLD (hyperlipidemia); Hypertension; Multiple gastric polyps (08/20/2011); Obesity; and OSA on CPAP. and presents today for an acute office visit.  1.) Hand numbness - This is a new problem. Associated symptom of numbness located in his left hand has been going on for about 3 weeks. Symptoms are worsened in the 4th and fifth fingers. Denies any modifying factors or attempted treatments. No loss of function. Timing of the symptoms are sporadic. No trauma or injury that he is aware of. Right hand dominant. He does carry around a heavy tablet at times.   2.) Hypertension - Currently maintained on amlodipine. Reports taking the medication as prescribed and denies adverse side effects. Blood pressures at home have been elevated above goal 140/90. Denies worst headache of life with no symptoms of end organ damage. Working on following a low-sodium diet.  BP Readings from Last 3 Encounters:  08/23/16 (!) 144/94  07/30/16 124/74  05/15/16 122/84     Allergies  Allergen Reactions  . Aspirin     Face swelling       Outpatient Medications Prior to Visit  Medication Sig Dispense Refill  . albuterol (PROAIR HFA) 108 (90 Base) MCG/ACT inhaler Inhale 2 puffs into the lungs every 6 (six) hours as needed. 3 Inhaler 1  . desvenlafaxine (PRISTIQ) 100 MG 24 hr tablet Take 100 mg by mouth daily.    . diazepam (VALIUM) 5 MG tablet TAKE 1 TO 2 TABLETS BY MOUTH AT BEDTIME AS NEEDED FOR MUSCLE SPASMS 60 tablet 0  . FLUoxetine (PROZAC) 10 MG capsule Take 10 mg by mouth daily.     . modafinil (PROVIGIL) 200 MG tablet Take 2 tablets  (400 mg total) by mouth daily. 60 tablet 0  . amLODipine (NORVASC) 5 MG tablet TAKE 1 TABLET (5 MG TOTAL) BY MOUTH DAILY. 90 tablet 3  . benzonatate (TESSALON) 100 MG capsule Take 1 capsule (100 mg total) by mouth 3 (three) times daily as needed for cough. (Patient not taking: Reported on 07/30/2016) 20 capsule 0  . mometasone-formoterol (DULERA) 200-5 MCG/ACT AERO USE 2 INHALATIONS ORALLY   INTO THE LUNGS DAILY 13 g 11   No facility-administered medications prior to visit.       Past Surgical History:  Procedure Laterality Date  . ESOPHAGOGASTRODUODENOSCOPY (EGD) WITH ESOPHAGEAL DILATION  08/20/2011  . WISDOM TOOTH EXTRACTION    . WRIST SURGERY  10/2010   cyst removed. Right wrist      Past Medical History:  Diagnosis Date  . Allergic rhinitis   . Asthma   . EE (eosinophilic esophagitis) 2/29/7989  . GERD (gastroesophageal reflux disease) 08/20/2011  . HLD (hyperlipidemia)   . Hypertension   . Multiple gastric polyps 08/20/2011  . Obesity   . OSA on CPAP       Review of Systems  Constitutional: Negative for chills and fever.  Eyes:       Negative for changes in vision  Respiratory: Negative for cough, chest tightness and wheezing.   Cardiovascular: Negative for chest pain, palpitations and leg swelling.  Musculoskeletal:  Positive for left wrist numbness.  Neurological: Positive for numbness. Negative for dizziness, weakness and light-headedness.      Objective:    BP (!) 144/94 (BP Location: Left Arm, Patient Position: Sitting, Cuff Size: Large)   Pulse (!) 118   Temp 98.2 F (36.8 C) (Oral)   Resp 16   Ht 5\' 7"  (1.702 m)   Wt 250 lb (113.4 kg)   SpO2 97%   BMI 39.16 kg/m  Nursing note and vital signs reviewed.  Physical Exam  Constitutional: He is oriented to person, place, and time. He appears well-developed and well-nourished. No distress.  Cardiovascular: Normal rate, regular rhythm, normal heart sounds and intact distal pulses.   Pulmonary/Chest:  Effort normal and breath sounds normal.  Musculoskeletal:  Left hand/wrist - no obvious deformity, discoloration, or edema. Palpable tenderness in the carpal tunnel with no crepitus or deformity. Symptoms reproduced with palpation of carpal tunnel. Range of motion within normal limits. Strength is normal. Positive Phalen's test.  Neurological: He is alert and oriented to person, place, and time.  Skin: Skin is warm and dry.  Psychiatric: He has a normal mood and affect. His behavior is normal. Judgment and thought content normal.       Assessment & Plan:   Problem List Items Addressed This Visit      Cardiovascular and Mediastinum   Essential hypertension    Blood pressures remained elevated above goal 140/90 with current medication regimen and no adverse side effects. Increase amlodipine. Encouraged to monitor blood pressure at home and follow low-sodium diet. Denies worst headache of life with no new symptoms of end organ damage noted on physical exam. Continue to monitor.      Relevant Medications   amLODipine (NORVASC) 10 MG tablet     Nervous and Auditory   Carpal tunnel syndrome    Symptoms and exam consistent with carpal tunnel syndrome with possibility of additional cubital tunnel syndrome. Start to [employer] and Pennsaid. Start icing regimen and cockup wrist splint as needed. Encouraged ergonomic assessment to ensure proper positioning. Follow-up if symptoms worsen or do not improve.          I have discontinued Mr. Santosuosso benzonatate. I have also changed his amLODipine. Additionally, I am having him start on Ibuprofen-Famotidine and Diclofenac Sodium. Lastly, I am having him maintain his albuterol, FLUoxetine, desvenlafaxine, modafinil, diazepam, and mometasone-formoterol.   Meds ordered this encounter  Medications  . mometasone-formoterol (DULERA) 200-5 MCG/ACT AERO    Sig: USE 2 INHALATIONS ORALLY   INTO THE LUNGS DAILY    Dispense:  13 g    Refill:  11  .  amLODipine (NORVASC) 10 MG tablet    Sig: Take 1 tablet (10 mg total) by mouth daily.    Dispense:  90 tablet    Refill:  0    Do not fill until patient requests refill.    Order Specific Question:   Supervising Provider    Answer:   Pricilla Holm A [[id number]]  . Ibuprofen-Famotidine 800-26.6 MG TABS    Sig: Take 1 tablet by mouth 3 (three) times daily as needed.    Dispense:  90 tablet    Refill:  1    Order Specific Question:   Supervising Provider    Answer:   Pricilla Holm A [[id number]]  . Diclofenac Sodium (PENNSAID) 2 % SOLN    Sig: Place 1 application onto the skin 2 (two) times daily as needed.    Dispense:  112 g  Refill:  1    Order Specific Question:   Supervising Provider    Answer:   Pricilla Holm A [[id number]]     Follow-up: Return in about 1 month (around 09/23/2016), or if symptoms worsen or fail to improve.  Mauricio Po, FNP

## 2016-08-23 NOTE — Assessment & Plan Note (Signed)
Symptoms and exam consistent with carpal tunnel syndrome with possibility of additional cubital tunnel syndrome. Start to [employer] and Pennsaid. Start icing regimen and cockup wrist splint as needed. Encouraged ergonomic assessment to ensure proper positioning. Follow-up if symptoms worsen or do not improve.

## 2016-08-29 ENCOUNTER — Telehealth: Payer: Self-pay | Admitting: Pulmonary Disease

## 2016-08-29 NOTE — Telephone Encounter (Signed)
Pharmacist called to get an update on pt's dulera. Informed her that pt's rx was done by [facility] Prose and that whose office they need to follow up with.

## 2016-09-04 ENCOUNTER — Telehealth: Payer: Self-pay

## 2016-09-04 MED ORDER — FLUTICASONE FUROATE-VILANTEROL 100-25 MCG/INH IN AEPB: 1.0000 | INHALATION_SPRAY | Freq: Every day | RESPIRATORY_TRACT | 2 refills | Status: DC

## 2016-09-04 NOTE — Telephone Encounter (Signed)
Please determine which pharmacy. Then Breo #60 with 2 refills. 1 puff daily.

## 2016-09-04 NOTE — Telephone Encounter (Signed)
Bryan Barber is not covered by insurance. Alternatives would be symbicort, advair, and breo ellipta. Please advise on which alternative you would like to use.

## 2016-09-04 NOTE — Telephone Encounter (Signed)
[facility] [address]

## 2016-09-04 NOTE — Addendum Note (Signed)
Addended by: Delice Bison E on: 09/04/2016 02:58 PM   Modules accepted: Orders

## 2016-09-14 ENCOUNTER — Other Ambulatory Visit: Payer: Self-pay | Admitting: Pulmonary Disease

## 2016-09-14 NOTE — Telephone Encounter (Signed)
Dr. Halford Chessman, Mr. Bosserman is asking for a refill on modafinil 200mg . Last OV with you was in 2017. Last OV with anyone in the office was on 07/30/16 with TP. Last RX was on 08/01/16 for 60 tabs with no refills.   Is it ok for him to receive another refill? [facility] [address] Thanks!

## 2016-09-17 NOTE — Telephone Encounter (Signed)
Medication was called in to pharmacy with Dr. Juanetta Gosling permission.

## 2016-10-02 ENCOUNTER — Other Ambulatory Visit: Payer: Self-pay | Admitting: Family

## 2016-10-25 ENCOUNTER — Other Ambulatory Visit: Payer: Self-pay | Admitting: *Deleted

## 2016-10-25 MED ORDER — FLUTICASONE FUROATE-VILANTEROL 100-25 MCG/INH IN AEPB: 1.0000 | INHALATION_SPRAY | Freq: Every day | RESPIRATORY_TRACT | 0 refills | Status: DC

## 2016-11-06 ENCOUNTER — Other Ambulatory Visit: Payer: Self-pay | Admitting: Pulmonary Disease

## 2016-11-08 NOTE — Telephone Encounter (Signed)
Dr. Halford Chessman, Mr. Mccollum is asking for a refill on modafinil 200mg . His OV with you was on 07/30/16. Last RX was on 09/17/16 for 60 tablets.    Is ok to refill this medication for him? Please advise. Thanks!

## 2016-11-12 ENCOUNTER — Telehealth: Payer: Self-pay | Admitting: Pulmonary Disease

## 2016-11-12 NOTE — Telephone Encounter (Signed)
lmomtcb x 1 for the pt to call back just to verify the correct pharmacy for the pt.  We can call this in for the pt.

## 2016-11-13 MED ORDER — MODAFINIL 200 MG PO TABS: 400.0000 mg | ORAL_TABLET | Freq: Every day | ORAL | 2 refills | Status: DC

## 2016-11-13 NOTE — Telephone Encounter (Signed)
Spoke with the pt and confirmed pharm  [facility] today  Rx called in and nothing further needed

## 2016-12-08 ENCOUNTER — Other Ambulatory Visit: Payer: Self-pay | Admitting: Family

## 2016-12-31 DIAGNOSIS — R7303 Prediabetes: Secondary | ICD-10-CM

## 2016-12-31 HISTORY — DX: Prediabetes: R73.03

## 2017-01-09 ENCOUNTER — Other Ambulatory Visit: Payer: Self-pay | Admitting: Family

## 2017-01-10 NOTE — Telephone Encounter (Signed)
Last refill was 10/03/16 per [facility] CS DB

## 2017-01-10 NOTE — Telephone Encounter (Signed)
Faxed

## 2017-04-29 ENCOUNTER — Other Ambulatory Visit: Payer: Self-pay | Admitting: Family

## 2017-05-06 ENCOUNTER — Other Ambulatory Visit: Payer: Self-pay | Admitting: Family

## 2017-05-09 NOTE — Progress Notes (Deleted)
[facility] [address], [place] [address] Phone: [phone] Subjective:    I'm seeing this patient by the request  of:    CC:   [id number]  NATALIE LECLAIRE is a 46 y.o. male coming in with complaint of ***  Onset-  Location Duration-  Character- Aggravating factors- Reliving factors-  Therapies tried-  Severity-     Past Medical History:  Diagnosis Date  . Allergic rhinitis   . Asthma   . EE (eosinophilic esophagitis) 9/83/3825  . GERD (gastroesophageal reflux disease) 08/20/2011  . HLD (hyperlipidemia)   . Hypertension   . Multiple gastric polyps 08/20/2011  . Obesity   . OSA on CPAP    Past Surgical History:  Procedure Laterality Date  . ESOPHAGOGASTRODUODENOSCOPY (EGD) WITH ESOPHAGEAL DILATION  08/20/2011  . WISDOM TOOTH EXTRACTION    . WRIST SURGERY  10/2010   cyst removed. Right wrist   Social History   Socioeconomic History  . Marital status: Married    Spouse name: Not on file  . Number of children: 1  . Years of education: Not on file  . Highest education level: Not on file  Social Needs  . Financial resource strain: Not on file  . Food insecurity - worry: Not on file  . Food insecurity - inability: Not on file  . Transportation needs - medical: Not on file  . Transportation needs - non-medical: Not on file  Occupational History  . Occupation: Health visitor: [employer]  Tobacco Use  . Smoking status: Former Smoker    Packs/day: 1.00    Years: 10.00    Pack years: 10.00    Types: Cigarettes    Last attempt to quit: 06/05/1995    Years since quitting: 21.9  . Smokeless tobacco: Never Used  Substance and Sexual Activity  . Alcohol use: No  . Drug use: No  . Sexual activity: Not on file    Comment: Long time ago   Other Topics Concern  . Not on file  Social History Narrative  . Not on file   Allergies  Allergen Reactions  . Aspirin     Face swelling    Family History  Problem Relation Age of  Onset  . Hypertension Father   . Sleep apnea Mother   . Heart disease Paternal Grandfather   . Pancreatic cancer Maternal Grandfather   . Cancer Maternal Grandmother   . Colon cancer Neg Hx      Past medical history, social, surgical and family history all reviewed in electronic medical record.  No pertanent information unless stated regarding to the chief complaint.   Review of Systems:Review of systems updated and as accurate as of 05/09/17  No headache, visual changes, nausea, vomiting, diarrhea, constipation, dizziness, abdominal pain, skin rash, fevers, chills, night sweats, weight loss, swollen lymph nodes, body aches, joint swelling, muscle aches, chest pain, shortness of breath, mood changes.   Objective  There were no vitals taken for this visit. Systems examined below as of 05/09/17   General: No apparent distress alert and oriented x3 mood and affect normal, dressed appropriately.  HEENT: Pupils equal, extraocular movements intact  Respiratory: Patient's speak in full sentences and does not appear short of breath  Cardiovascular: No lower extremity edema, non tender, no erythema  Skin: Warm dry intact with no signs of infection or rash on extremities or on axial skeleton.  Abdomen: Soft nontender  Neuro: Cranial nerves II through XII  are intact, neurovascularly intact in all extremities with 2+ DTRs and 2+ pulses.  Lymph: No lymphadenopathy of posterior or anterior cervical chain or axillae bilaterally.  Gait normal with good balance and coordination.  MSK:  Non tender with full range of motion and good stability and symmetric strength and tone of shoulders, elbows, wrist, hip, knee and ankles bilaterally.     Impression and Recommendations:     This case required medical decision making of moderate complexity.      Note: This dictation was prepared with Dragon dictation along with smaller phrase technology. Any transcriptional errors that result from this process  are unintentional.

## 2017-05-10 ENCOUNTER — Ambulatory Visit: Payer: 59 | Admitting: Family

## 2017-05-10 ENCOUNTER — Encounter: Payer: Self-pay | Admitting: Family

## 2017-05-10 ENCOUNTER — Ambulatory Visit: Payer: 59 | Admitting: Family Medicine

## 2017-05-10 VITALS — BP 142/86 | HR 109 | Temp 98.2°F | Ht 67.0 in | Wt 259.0 lb

## 2017-05-10 DIAGNOSIS — I10 Essential (primary) hypertension: Secondary | ICD-10-CM | POA: Diagnosis not present

## 2017-05-10 DIAGNOSIS — R1310 Dysphagia, unspecified: Secondary | ICD-10-CM | POA: Diagnosis not present

## 2017-05-10 DIAGNOSIS — J4520 Mild intermittent asthma, uncomplicated: Secondary | ICD-10-CM | POA: Diagnosis not present

## 2017-05-10 MED ORDER — BUDESONIDE-FORMOTEROL FUMARATE 160-4.5 MCG/ACT IN AERO: 2.0000 | INHALATION_SPRAY | Freq: Two times a day (BID) | RESPIRATORY_TRACT | 3 refills | Status: DC

## 2017-05-10 MED ORDER — AMLODIPINE BESYLATE 10 MG PO TABS: 10.0000 mg | ORAL_TABLET | Freq: Every day | ORAL | 0 refills | Status: DC

## 2017-05-10 MED ORDER — LOSARTAN POTASSIUM 50 MG PO TABS: 50.0000 mg | ORAL_TABLET | Freq: Every day | ORAL | 0 refills | Status: DC

## 2017-05-10 MED ORDER — ALBUTEROL SULFATE HFA 108 (90 BASE) MCG/ACT IN AERS: 2.0000 | INHALATION_SPRAY | Freq: Four times a day (QID) | RESPIRATORY_TRACT | 1 refills | Status: DC | PRN

## 2017-05-10 NOTE — Progress Notes (Signed)
Bryan Barber is a 46 y.o. male with the following history as recorded in EpicCare:  Patient Active Problem List   Diagnosis Date Noted  . Carpal tunnel syndrome 08/23/2016  . Hypersomnia with sleep apnea 07/30/2016  . Jaw pain 12/05/2015  . Stye 12/05/2015  . Shift work sleep disorder 09/12/2015  . Essential hypertension 09/12/2015  . Obesity, Class II, BMI 35-39.9, with comorbidity 09/21/2013  . BMI 36.0-36.9,adult 10/22/2011  . Seasonal allergic rhinitis 10/08/2011  . Eosinophilic esophagitis 76/28/3151  .  GERD - erosive esophagitis 08/13/2011  . Inadequate sleep hygiene 11/21/2010  . HYPERLIPIDEMIA 01/25/2009  . OBSESSIVE-COMPULSIVE DISORDER 01/25/2009  . Obstructive sleep apnea 10/14/2007  . Asthma 07/22/2007    Current Outpatient Medications  Medication Sig Dispense Refill  . albuterol (PROAIR HFA) 108 (90 Base) MCG/ACT inhaler Inhale 2 puffs into the lungs every 6 (six) hours as needed. 3 Inhaler 1  . amLODipine (NORVASC) 10 MG tablet Take 1 tablet (10 mg total) by mouth daily. 90 tablet 0  . diazepam (VALIUM) 5 MG tablet TAKE 1 TO 2 TABLETS BY MOUTH AT BEDTIME AS NEEDED FOR MUSCLE SPASMS 60 tablet 0  . modafinil (PROVIGIL) 200 MG tablet Take 2 tablets (400 mg total) by mouth daily. 180 tablet 2  . budesonide-formoterol (SYMBICORT) 160-4.5 MCG/ACT inhaler Inhale 2 puffs into the lungs 2 (two) times daily. 1 Inhaler 3  . losartan (COZAAR) 50 MG tablet Take 1 tablet (50 mg total) by mouth daily. 90 tablet 0   No current facility-administered medications for this visit.     Allergies: Aspirin  Past Medical History:  Diagnosis Date  . Allergic rhinitis   . Asthma   . EE (eosinophilic esophagitis) 7/61/6073  . GERD (gastroesophageal reflux disease) 08/20/2011  . HLD (hyperlipidemia)   . Hypertension   . Multiple gastric polyps 08/20/2011  . Obesity   . OSA on CPAP     Past Surgical History:  Procedure Laterality Date  . ESOPHAGOGASTRODUODENOSCOPY (EGD) WITH  ESOPHAGEAL DILATION  08/20/2011  . WISDOM TOOTH EXTRACTION    . WRIST SURGERY  10/2010   cyst removed. Right wrist    Family History  Problem Relation Age of Onset  . Hypertension Father   . Sleep apnea Mother   . Heart disease Paternal Grandfather   . Pancreatic cancer Maternal Grandfather   . Cancer Maternal Grandmother   . Colon cancer Neg Hx     Social History   Tobacco Use  . Smoking status: Former Smoker    Packs/day: 1.00    Years: 10.00    Pack years: 10.00    Types: Cigarettes    Last attempt to quit: 06/05/1995    Years since quitting: 21.9  . Smokeless tobacco: Never Used  Substance Use Topics  . Alcohol use: No    Subjective:  Patient presents to follow-up on his chronic care needs:  1) Hypertension- only taking Amlodipine 10 mg daily; taking medication daily; denies any headache, chest pain, shortness of breath, blurred vision; admits not eating right/ not exercising;   2) Asthma- does not feel that [facility] is working for his asthma; was doing well on [place] but insurance will no longer cover; has been feeling that his asthma has been flared up in the past 2 weeks since recovering from URI;   3) Anxiety- has been able to taper himself off Prozac; has noticed that jaw clenching has resolved; is agreeable to not getting refill on Valium as this is primary reason he was using  this medicaiton.  4) GERD- has needed esophageal dilatation in the past; feels that food is starting to get "stuck" again; would like updated referral Objective:  Vitals:   05/10/17 0916  BP: (!) 142/86  Pulse: (!) 109  Temp: 98.2 F (36.8 C)  TempSrc: Oral  SpO2: 98%  Weight: 259 lb (117.5 kg)  Height: 5\' 7"  (1.702 m)    General: Well developed, well nourished, in no acute distress  Skin : Warm and dry.  Head: Normocephalic and atraumatic  Eyes: Sclera and conjunctiva clear; pupils round and reactive to light; extraocular movements intact  Ears: External normal; canals clear; tympanic  membranes normal  Oropharynx: Pink, supple. No suspicious lesions  Neck: Supple without thyromegaly, adenopathy  Lungs: Respirations unlabored; clear to auscultation bilaterally without wheeze, rales, rhonchi  [facility] exam: normal rate and regular rhythm.  Musculoskeletal: No deformities; no active joint inflammation  Extremities: No edema, cyanosis, clubbing  Vessels: Symmetric bilaterally  Neurologic: Alert and oriented; speech intact; face symmetrical; moves all extremities well; CNII-XII intact without focal deficit   Assessment:  1. Essential hypertension   2. Mild intermittent intrinsic asthma without complication   3. Dysphagia, unspecified type     Plan:  1. Uncontrolled; continue Amlodipine 10 mg daily; add Losartan 50 mg daily; encouraged exercise, weight loss; EKG is updated today- no acute change noted when compared to previous EKG; if pulse remains elevated at next visit, will need to consider cardiology evaluation. Follow-up in 2 weeks, sooner prn. 2. Rx for Symbicort 160/4.5 2 puffs bid;  3. Refer to GI to discuss possible dilatation.   Return in about 2 weeks (around 05/24/2017).  Orders Placed This Encounter  Procedures  . Ambulatory referral to Gastroenterology    Referral Priority:   Routine    Referral Type:   Consultation    Referral Reason:   Specialty Services Required    Number of Visits Requested:   1  . EKG 12-Lead    Requested Prescriptions   Signed Prescriptions Disp Refills  . amLODipine (NORVASC) 10 MG tablet 90 tablet 0    Sig: Take 1 tablet (10 mg total) by mouth daily.  Marland Kitchen albuterol (PROAIR HFA) 108 (90 Base) MCG/ACT inhaler 3 Inhaler 1    Sig: Inhale 2 puffs into the lungs every 6 (six) hours as needed.  . budesonide-formoterol (SYMBICORT) 160-4.5 MCG/ACT inhaler 1 Inhaler 3    Sig: Inhale 2 puffs into the lungs 2 (two) times daily.  Marland Kitchen losartan (COZAAR) 50 MG tablet 90 tablet 0    Sig: Take 1 tablet (50 mg total) by mouth daily.

## 2017-05-14 ENCOUNTER — Other Ambulatory Visit: Payer: Self-pay | Admitting: Pulmonary Disease

## 2017-05-15 ENCOUNTER — Telehealth: Payer: Self-pay | Admitting: Pulmonary Disease

## 2017-05-15 MED ORDER — MODAFINIL 200 MG PO TABS: 400.0000 mg | ORAL_TABLET | Freq: Every day | ORAL | 1 refills | Status: DC

## 2017-05-15 NOTE — Telephone Encounter (Deleted)
VS please advise on refill. Thanks. 

## 2017-05-15 NOTE — Telephone Encounter (Signed)
Okay to send refill. 

## 2017-05-15 NOTE — Telephone Encounter (Signed)
Spoke with pt. He is needing a refill on Modafinil. This was last refilled on 11/13/16 #180. He is not supposed to follow up with VS until February.  VS - please advise on refill. Thanks!

## 2017-05-15 NOTE — Telephone Encounter (Signed)
rx called in to preferred pharmacy.  Pt aware.  Nothing further needed.

## 2017-05-20 ENCOUNTER — Other Ambulatory Visit (INDEPENDENT_AMBULATORY_CARE_PROVIDER_SITE_OTHER): Payer: 59

## 2017-05-20 ENCOUNTER — Ambulatory Visit: Payer: 59 | Admitting: Family

## 2017-05-20 ENCOUNTER — Encounter: Payer: Self-pay | Admitting: Family

## 2017-05-20 ENCOUNTER — Ambulatory Visit (INDEPENDENT_AMBULATORY_CARE_PROVIDER_SITE_OTHER)
Admission: RE | Admit: 2017-05-20 | Discharge: 2017-05-20 | Disposition: A | Discharge status: Home / Self Care | Payer: 59 | Source: Ambulatory Visit | Attending: Family | Admitting: Family

## 2017-05-20 VITALS — BP 136/86 | HR 105 | Temp 98.0°F | Wt 259.0 lb

## 2017-05-20 DIAGNOSIS — R609 Edema, unspecified: Secondary | ICD-10-CM | POA: Diagnosis not present

## 2017-05-20 DIAGNOSIS — Z113 Encounter for screening for infections with a predominantly sexual mode of transmission: Secondary | ICD-10-CM | POA: Diagnosis not present

## 2017-05-20 DIAGNOSIS — Z23 Encounter for immunization: Secondary | ICD-10-CM | POA: Diagnosis not present

## 2017-05-20 DIAGNOSIS — I10 Essential (primary) hypertension: Secondary | ICD-10-CM

## 2017-05-20 DIAGNOSIS — R0602 Shortness of breath: Secondary | ICD-10-CM | POA: Diagnosis not present

## 2017-05-20 LAB — COMPREHENSIVE METABOLIC PANEL
ALBUMIN: 4.2 g/dL (ref 3.5–5.2)
ALK PHOS: 75 U/L (ref 39–117)
ALT: 28 U/L (ref 0–53)
AST: 20 U/L (ref 0–37)
BILIRUBIN TOTAL: 0.4 mg/dL (ref 0.2–1.2)
BUN: 13 mg/dL (ref 6–23)
CALCIUM: 9 mg/dL (ref 8.4–10.5)
CO2: 23 mEq/L (ref 19–32)
Chloride: 105 mEq/L (ref 96–112)
Creatinine, Ser: 0.96 mg/dL (ref 0.40–1.50)
GFR: 90.39 mL/min (ref >60.00)
GLUCOSE: 93 mg/dL (ref 70–99)
POTASSIUM: 3.7 meq/L (ref 3.5–5.1)
Sodium: 138 mEq/L (ref 135–145)
TOTAL PROTEIN: 7.5 g/dL (ref 6.0–8.3)

## 2017-05-20 LAB — CBC
HCT: 46.4 % (ref 39.0–52.0)
HEMOGLOBIN: 15.5 g/dL (ref 13.0–17.0)
MCHC: 33.5 g/dL (ref 30.0–36.0)
MCV: 91.6 fl (ref 78.0–100.0)
PLATELETS: 237 10*3/uL (ref 150.0–400.0)
RBC: 5.07 Mil/uL (ref 4.22–5.81)
RDW: 14.6 % (ref 11.5–15.5)
WBC: 5 10*3/uL (ref 4.0–10.5)

## 2017-05-20 LAB — TSH: TSH: 2.57 u[IU]/mL (ref 0.35–4.50)

## 2017-05-20 LAB — BRAIN NATRIURETIC PEPTIDE: Pro B Natriuretic peptide (BNP): 5 pg/mL (ref 0.0–100.0)

## 2017-05-20 MED ORDER — LOSARTAN POTASSIUM 100 MG PO TABS: 100.0000 mg | ORAL_TABLET | Freq: Every day | ORAL | 0 refills | Status: DC

## 2017-05-20 MED ORDER — PREDNISONE 20 MG PO TABS: 20.0000 mg | ORAL_TABLET | Freq: Every day | ORAL | 0 refills | Status: DC

## 2017-05-20 MED ORDER — LOSARTAN POTASSIUM 100 MG PO TABS: 100.0000 mg | ORAL_TABLET | Freq: Every day | ORAL | 1 refills | Status: DC

## 2017-05-20 NOTE — Progress Notes (Signed)
Bryan Barber is a 46 y.o. male with the following history as recorded in [employer]:  Patient Active Problem List   Diagnosis Date Noted  . Carpal tunnel syndrome 08/23/2016  . Hypersomnia with sleep apnea 07/30/2016  . Jaw pain 12/05/2015  . Stye 12/05/2015  . Shift work sleep disorder 09/12/2015  . Essential hypertension 09/12/2015  . Obesity, Class II, BMI 35-39.9, with comorbidity 09/21/2013  . BMI 36.0-36.9,adult 10/22/2011  . Seasonal allergic rhinitis 10/08/2011  . Eosinophilic esophagitis 38/75/6433  .  GERD - erosive esophagitis 08/13/2011  . Inadequate sleep hygiene 11/21/2010  . HYPERLIPIDEMIA 01/25/2009  . OBSESSIVE-COMPULSIVE DISORDER 01/25/2009  . Obstructive sleep apnea 10/14/2007  . Asthma 07/22/2007    Current Outpatient Medications  Medication Sig Dispense Refill  . albuterol (PROAIR HFA) 108 (90 Base) MCG/ACT inhaler Inhale 2 puffs into the lungs every 6 (six) hours as needed. 3 Inhaler 1  . amLODipine (NORVASC) 10 MG tablet Take 1 tablet (10 mg total) by mouth daily. 90 tablet 0  . budesonide-formoterol (SYMBICORT) 160-4.5 MCG/ACT inhaler Inhale 2 puffs into the lungs 2 (two) times daily. 1 Inhaler 3  . diazepam (VALIUM) 5 MG tablet TAKE 1 TO 2 TABLETS BY MOUTH AT BEDTIME AS NEEDED FOR MUSCLE SPASMS 60 tablet 0  . modafinil (PROVIGIL) 200 MG tablet Take 2 tablets (400 mg total) by mouth daily. 180 tablet 1  . losartan (COZAAR) 100 MG tablet Take 1 tablet (100 mg total) by mouth daily. 90 tablet 1  . losartan (COZAAR) 100 MG tablet Take 1 tablet (100 mg total) by mouth daily. 90 tablet 0  . predniSONE (DELTASONE) 20 MG tablet Take 1 tablet (20 mg total) by mouth daily with breakfast. 5 tablet 0  . predniSONE (DELTASONE) 20 MG tablet Take 1 tablet (20 mg total) by mouth daily with breakfast. 5 tablet 0   No current facility-administered medications for this visit.     Allergies: Aspirin  Past Medical History:  Diagnosis Date  . Allergic rhinitis   . Asthma    . EE (eosinophilic esophagitis) 2/95/1884  . GERD (gastroesophageal reflux disease) 08/20/2011  . HLD (hyperlipidemia)   . Hypertension   . Multiple gastric polyps 08/20/2011  . Obesity   . OSA on CPAP     Past Surgical History:  Procedure Laterality Date  . ESOPHAGOGASTRODUODENOSCOPY (EGD) WITH ESOPHAGEAL DILATION  08/20/2011  . WISDOM TOOTH EXTRACTION    . WRIST SURGERY  10/2010   cyst removed. Right wrist    Family History  Problem Relation Age of Onset  . Hypertension Father   . Sleep apnea Mother   . Heart disease Paternal Grandfather   . Pancreatic cancer Maternal Grandfather   . Cancer Maternal Grandmother   . Colon cancer Neg Hx     Social History   Tobacco Use  . Smoking status: Former Smoker    Packs/day: 1.00    Years: 10.00    Pack years: 10.00    Types: Cigarettes    Last attempt to quit: 06/05/1995    Years since quitting: 21.9  . Smokeless tobacco: Never Used  Substance Use Topics  . Alcohol use: No    Subjective:  2 week follow-up on chronic care needs: 1) HTN- at last visit, Losartan 50 mg was added to Amlodipine 10 mg; tolerating well with no concerns; Denies any chest pain, shortness of breath, blurred vision or headache  2) Shortness of breath/ asthma- was started on Symbicort as he did not feel Memory Dance was working;  tolerating well- not wheezing but still feels like he is very easily winded with activity; notes that this specific sensation only seemed to start after recent URI- "just don't feel like I have gotten back to my normal yet." Denies any chest pain on exertion;  3) GI did call patient about scheduling for follow-up- he has not been able to follow-up due to fluctuating work schedule.   4) Notes that in general, he "just feels swollen" all over; feels like his hands are more puffy; does occasionally see a line in his ankles from his socks; feels like neck is "puffy." Has been on Amlodipine for at least 1 year;     Objective:  Vitals:   05/20/17  0908  BP: 136/86  Pulse: (!) 105  Temp: 98 F (36.7 C)  SpO2: 99%  Weight: 259 lb (117.5 kg)    General: Well developed, well nourished, in no acute distress  Skin : Warm and dry.  Head: Normocephalic and atraumatic  Eyes: Sclera and conjunctiva clear; pupils round and reactive to light; extraocular movements intact  Ears: External normal; canals clear; tympanic membranes normal  Oropharynx: Pink, supple. No suspicious lesions  Neck: Supple without thyromegaly, adenopathy  Lungs: Respirations unlabored; clear to auscultation bilaterally without wheeze, rales, rhonchi  CVS exam: normal rate and regular rhythm.  Abdomen: Soft; nontender; nondistended; normoactive bowel sounds; no masses or hepatosplenomegaly  Musculoskeletal: No deformities; no active joint inflammation  Extremities: No edema, cyanosis, clubbing  Vessels: Symmetric bilaterally  Neurologic: Alert and oriented; speech intact; face symmetrical; moves all extremities well; CNII-XII intact without focal deficit   Assessment:  1. Essential hypertension   2. Screen for STD (sexually transmitted disease)   3. Shortness of breath   4. Swelling   5. Need for influenza vaccination     Plan:  1. Improved but still not at goal; increase Losartan to 100 mg daily; continue Amlodipine 10 mg; encourage to work on weight loss/ exercise; follow-up in 4 weeks; 2. Check HIV, RPR per patient request; defers G/C and denies any symptoms. 3. Unable to repeat EKG today as machine is broken; pulse is averaging around 100 but rate is normal; ? Etiology; update CXR and refer to cardiology; will give 5 day course of Prednisone 20 mg qd since he feels like symptoms only started after recent URI/ asthma flare. His sleep apnea is being followed and evaluated by pulmonology- per patient was told recently that no adjustment needed. 4. ? Possible related to Amlodipine; may need to consider making a change; follow-up to be determined after labs  returned. 5. Flu shot given today;   Return in about 1 month (around 06/20/2017).  Orders Placed This Encounter  Procedures  . DG Chest 2 View    Standing Status:   Future    Number of Occurrences:   1    Standing Expiration Date:   07/21/2018    Order Specific Question:   Reason for Exam (SYMPTOM  OR DIAGNOSIS REQUIRED)    Answer:   shortness of breath    Order Specific Question:   Preferred imaging location?    Answer:   [facility]    Order Specific Question:   Radiology Contrast Protocol - do NOT remove file path    Answer:   [email or web address]  . Flu Vaccine QUAD 6+ mos PF IM (Fluarix Quad PF)  . CBC    Standing Status:   Future    Number of Occurrences:   1    Standing  Expiration Date:   05/20/2018  . Comprehensive metabolic panel    Standing Status:   Future    Number of Occurrences:   1    Standing Expiration Date:   05/20/2018  . TSH    Standing Status:   Future    Number of Occurrences:   1    Standing Expiration Date:   05/20/2018  . HIV antibody    Standing Status:   Future    Number of Occurrences:   1    Standing Expiration Date:   05/20/2018  . RPR    Standing Status:   Future    Number of Occurrences:   1    Standing Expiration Date:   05/20/2018  . B Nat Peptide    Standing Status:   Future    Number of Occurrences:   1    Standing Expiration Date:   05/20/2018  . Ambulatory referral to Cardiology    Referral Priority:   Routine    Referral Type:   Consultation    Referral Reason:   Specialty Services Required    Requested Specialty:   Cardiology    Number of Visits Requested:   1    Requested Prescriptions   Signed Prescriptions Disp Refills  . losartan (COZAAR) 100 MG tablet 90 tablet 1    Sig: Take 1 tablet (100 mg total) by mouth daily.  . predniSONE (DELTASONE) 20 MG tablet 5 tablet 0    Sig: Take 1 tablet (20 mg total) by mouth daily with breakfast.  . losartan (COZAAR) 100 MG tablet 90  tablet 0    Sig: Take 1 tablet (100 mg total) by mouth daily.  . predniSONE (DELTASONE) 20 MG tablet 5 tablet 0    Sig: Take 1 tablet (20 mg total) by mouth daily with breakfast.

## 2017-05-20 NOTE — Patient Instructions (Addendum)
Increase your Losartan to 100 mg daily; you can double up on your Losartan 50 mg daily to use what you have; You have the written prescription for the new dosage.   Please get the CXR and labs done today; assuming this is normal, I will consider changing the Amlodipine as this could be causing the swelling.  I am sending in a 5 day course of prednisone to help with the sense of air hunger since you feel this is related to resolving asthma flare. I want you to see cardiology in follow-up about this as well.   Please purchase OTC wrist splint to help with the numbness/ tingling you mentioned in your hands;

## 2017-05-21 LAB — RPR: RPR Ser Ql: NONREACTIVE

## 2017-05-21 LAB — HIV ANTIBODY (ROUTINE TESTING W REFLEX): HIV 1&2 Ab, 4th Generation: NONREACTIVE

## 2017-06-17 ENCOUNTER — Ambulatory Visit: Payer: 59 | Admitting: Internal Medicine

## 2017-07-22 ENCOUNTER — Ambulatory Visit: Payer: 59 | Admitting: Internal Medicine

## 2017-08-02 ENCOUNTER — Telehealth: Payer: Self-pay | Admitting: Family

## 2017-08-02 ENCOUNTER — Encounter: Payer: Self-pay | Admitting: Pulmonary Disease

## 2017-08-02 ENCOUNTER — Ambulatory Visit: Payer: 59 | Admitting: Internal Medicine

## 2017-08-02 ENCOUNTER — Ambulatory Visit: Payer: BLUE CROSS/BLUE SHIELD | Admitting: Pulmonary Disease

## 2017-08-02 VITALS — BP 122/80 | HR 108 | Ht 69.0 in | Wt 264.0 lb

## 2017-08-02 DIAGNOSIS — G4733 Obstructive sleep apnea (adult) (pediatric): Secondary | ICD-10-CM

## 2017-08-02 DIAGNOSIS — R0609 Other forms of dyspnea: Secondary | ICD-10-CM

## 2017-08-02 DIAGNOSIS — J45998 Other asthma: Secondary | ICD-10-CM

## 2017-08-02 DIAGNOSIS — Z9989 Dependence on other enabling machines and devices: Secondary | ICD-10-CM

## 2017-08-02 DIAGNOSIS — R400 Somnolence: Secondary | ICD-10-CM

## 2017-08-02 MED ORDER — MODAFINIL 200 MG PO TABS: 400.0000 mg | ORAL_TABLET | Freq: Every day | ORAL | 3 refills | Status: DC

## 2017-08-02 MED ORDER — FLUTICASONE-SALMETEROL 250-50 MCG/DOSE IN AEPB
1.0000 | INHALATION_SPRAY | Freq: Two times a day (BID) | RESPIRATORY_TRACT | 5 refills | Status: DC
Start: 2017-08-02 — End: 2018-05-05

## 2017-08-02 NOTE — Telephone Encounter (Signed)
Left message for patient and CRM created incase he calls back.

## 2017-08-02 NOTE — Patient Instructions (Signed)
Will try to arrange for home care company for new CPAP machine and supplies  Will schedule pulmonary function test  Follow up in 2 months after getting new CPAP machine

## 2017-08-02 NOTE — Telephone Encounter (Signed)
Received note requesting refill on his Losartan; according to our records, he was given #90 in December with 1 refill; he will need to be seen to get more medication; he needs to get established with a new PCP in this office for continued care.

## 2017-08-02 NOTE — Progress Notes (Signed)
[facility]  Chief Complaint  Patient presents with  . Follow-up    Pt is doing well overall. Pt has concern regarding heart rate elevating in last few months. Pt needs refill on proair, and would like to go back to advair, pt perfers not to use symbicort anymore    Vital signs: BP 122/80 (BP Location: Left Arm, Cuff Size: Normal)   Pulse (!) 108   Ht 5\' 9"  (1.753 m)   Wt 264 lb (119.7 kg)   SpO2 96%   BMI 38.99 kg/m   History of Present Illness: Bryan Barber is a 47 y.o. male obstructive sleep apnea with daytime hypersomnolence.  He has been using CPAP.  Can't sleep without this.  He buys his supplies on his own.  It was too expensive going through his insurance before.  He has new insurance now.    He has noticed trouble with his breathing with exertion.  He gets occasional wheeze.  He was on advair and felt this worked better.   He hasn't had PFTs recently.  He had chest xray in December 2018 that was normal.   Physical Exam:  General - pleasant Eyes - pupils reactive ENT - no sinus tenderness, no oral exudate, no LAN, MP 3 2+ tonsils, elongated uvula Cardiac - regular, no murmur Chest - no wheeze, rales Abd - soft, non tender Ext - no edema Skin - no rashes Neuro - normal strength Psych - normal mood  Assessment/Plan:  Obstructive sleep apnea. - he is compliant with CPAP and reports benefit - will try to get him set up with a DME - will try to arrange for new auto CPAP and new supplies - if this is too expensive, then he would continue to do on his own - explained he might need new sleep study >> could do home study  Asthma. - will change him back to advair - continue prn albuterol - will arrange for PFTs  Dyspnea on exertion. - some of this could be related to asthma - I suspect a good portion of this is related to obesity and deconditioning  Daytime hypersomnia in setting of sleep apnea. - continue  provigil   Patient Instructions  Will try to arrange for home care company for new CPAP machine and supplies  Will schedule pulmonary function test  Follow up in 2 months after getting new CPAP machine    Chesley Mires, MD [place] 08/02/2017, 2:23 PM Pager:  [phone]  Flow Sheet  Sleep tests: PSG 04/13/05 >> AHI 60  Past Medical History: He  has a past medical history of Allergic rhinitis, Asthma, EE (eosinophilic esophagitis) (9/79/8921), GERD (gastroesophageal reflux disease) (08/20/2011), HLD (hyperlipidemia), Hypertension, Multiple gastric polyps (08/20/2011), Obesity, and OSA on CPAP.  Past Surgical History: He  has a past surgical history that includes Wrist surgery (10/2010); Wisdom tooth extraction; and Esophagogastroduodenoscopy (egd) with esophageal dilation (08/20/2011).  Family History: His family history includes Cancer in his maternal grandmother; Heart disease in his paternal grandfather; Hypertension in his father; Pancreatic cancer in his maternal grandfather; Sleep apnea in his mother.  Social History: He  reports that he quit smoking about 22 years ago. His smoking use included cigarettes. He has a 10.00 pack-year smoking history. he has never used smokeless tobacco. He reports that he does not drink alcohol or use drugs.  Medications: Allergies as of 08/02/2017      Reactions   Aspirin    Face swelling  Medication List        Accurate as of 08/02/17  2:23 PM. Always use your most recent med list.          albuterol 108 (90 Base) MCG/ACT inhaler Commonly known as:  PROAIR HFA Inhale 2 puffs into the lungs every 6 (six) hours as needed.   amLODipine 10 MG tablet Commonly known as:  NORVASC Take 1 tablet (10 mg total) by mouth daily.   diazepam 5 MG tablet Commonly known as:  VALIUM TAKE 1 TO 2 TABLETS BY MOUTH AT BEDTIME AS NEEDED FOR MUSCLE SPASMS   Fluticasone-Salmeterol 250-50 MCG/DOSE Aepb Commonly known as:   ADVAIR DISKUS Inhale 1 puff into the lungs 2 (two) times daily.   losartan 100 MG tablet Commonly known as:  COZAAR Take 1 tablet (100 mg total) by mouth daily.   modafinil 200 MG tablet Commonly known as:  PROVIGIL Take 2 tablets (400 mg total) by mouth daily.

## 2017-08-05 ENCOUNTER — Other Ambulatory Visit: Payer: Self-pay | Admitting: Family

## 2017-08-09 ENCOUNTER — Ambulatory Visit (INDEPENDENT_AMBULATORY_CARE_PROVIDER_SITE_OTHER): Payer: BLUE CROSS/BLUE SHIELD | Admitting: Pulmonary Disease

## 2017-08-09 DIAGNOSIS — J45998 Other asthma: Secondary | ICD-10-CM

## 2017-08-09 LAB — PULMONARY FUNCTION TEST
DL/VA % PRED: 136 %
DL/VA: 6.21 ml/min/mmHg/L
DLCO UNC: 35.46 ml/min/mmHg
DLCO unc % pred: 119 %
FEF 25-75 POST: 3.83 L/s
FEF 25-75 Pre: 2.75 L/sec
FEF2575-%Change-Post: 39 %
FEF2575-%PRED-POST: 107 %
FEF2575-%PRED-PRE: 76 %
FEV1-%CHANGE-POST: 10 %
FEV1-%Pred-Post: 84 %
FEV1-%Pred-Pre: 76 %
FEV1-Post: 3.26 L
FEV1-Pre: 2.96 L
FEV1FVC-%Change-Post: 4 %
FEV1FVC-%PRED-PRE: 99 %
FEV6-%Change-Post: 6 %
FEV6-%PRED-POST: 83 %
FEV6-%Pred-Pre: 78 %
FEV6-POST: 3.96 L
FEV6-Pre: 3.73 L
FEV6FVC-%CHANGE-POST: 0 %
FEV6FVC-%PRED-POST: 103 %
FEV6FVC-%Pred-Pre: 102 %
FVC-%Change-Post: 5 %
FVC-%PRED-POST: 81 %
FVC-%PRED-PRE: 76 %
FVC-PRE: 3.74 L
FVC-Post: 3.96 L
POST FEV1/FVC RATIO: 82 %
PRE FEV1/FVC RATIO: 79 %
PRE FEV6/FVC RATIO: 100 %
Post FEV6/FVC ratio: 100 %
RV % pred: 112 %
RV: 2.03 L
TLC % PRED: 88 %
TLC: 5.77 L

## 2017-08-09 NOTE — Progress Notes (Signed)
PFT done today. 

## 2017-08-10 ENCOUNTER — Other Ambulatory Visit: Payer: Self-pay | Admitting: Family

## 2017-08-14 ENCOUNTER — Other Ambulatory Visit: Payer: Self-pay | Admitting: Family

## 2017-08-19 ENCOUNTER — Telehealth: Payer: Self-pay | Admitting: Pulmonary Disease

## 2017-08-19 NOTE — Telephone Encounter (Signed)
Patient has appointment set up to transfer to Dr. Cathlean Cower on 08/30/17.  Patient is requesting amlodipine to be refilled through this appointment date.  Patient would like script to be sent to [facility] [place]  Store number [id number].

## 2017-08-19 NOTE — Telephone Encounter (Signed)
Called and spoke with pt letting him know the results of his pft.  Pt expressed understanding. Nothing further needed at this current time.

## 2017-08-19 NOTE — Telephone Encounter (Signed)
PFT 08/09/17 >> FEV1 3.26 (84%), FEV1% 82, TC 5.77 (88%), DLCO 119%, +BD    Please let him know his PFT is suggestive of asthma.  He should continue using advair.

## 2017-08-20 ENCOUNTER — Other Ambulatory Visit: Payer: Self-pay | Admitting: Family

## 2017-08-20 MED ORDER — AMLODIPINE BESYLATE 10 MG PO TABS
10.0000 mg | ORAL_TABLET | Freq: Every day | ORAL | 0 refills | Status: DC
Start: 2017-08-20 — End: 2017-08-30

## 2017-08-23 ENCOUNTER — Ambulatory Visit: Payer: 59 | Admitting: Internal Medicine

## 2017-08-30 ENCOUNTER — Ambulatory Visit: Payer: BLUE CROSS/BLUE SHIELD | Admitting: Internal Medicine

## 2017-08-30 ENCOUNTER — Other Ambulatory Visit (INDEPENDENT_AMBULATORY_CARE_PROVIDER_SITE_OTHER): Payer: BLUE CROSS/BLUE SHIELD

## 2017-08-30 ENCOUNTER — Encounter: Payer: Self-pay | Admitting: Internal Medicine

## 2017-08-30 VITALS — BP 128/86 | HR 110 | Temp 98.3°F | Ht 69.0 in | Wt 267.0 lb

## 2017-08-30 DIAGNOSIS — Z0000 Encounter for general adult medical examination without abnormal findings: Secondary | ICD-10-CM | POA: Diagnosis not present

## 2017-08-30 DIAGNOSIS — R000 Tachycardia, unspecified: Secondary | ICD-10-CM

## 2017-08-30 DIAGNOSIS — R739 Hyperglycemia, unspecified: Secondary | ICD-10-CM | POA: Insufficient documentation

## 2017-08-30 DIAGNOSIS — I10 Essential (primary) hypertension: Secondary | ICD-10-CM | POA: Diagnosis not present

## 2017-08-30 DIAGNOSIS — J4520 Mild intermittent asthma, uncomplicated: Secondary | ICD-10-CM

## 2017-08-30 DIAGNOSIS — Z0001 Encounter for general adult medical examination with abnormal findings: Secondary | ICD-10-CM

## 2017-08-30 DIAGNOSIS — K429 Umbilical hernia without obstruction or gangrene: Secondary | ICD-10-CM

## 2017-08-30 LAB — URINALYSIS, ROUTINE W REFLEX MICROSCOPIC
BILIRUBIN URINE: NEGATIVE
Hgb urine dipstick: NEGATIVE
Ketones, ur: NEGATIVE
LEUKOCYTES UA: NEGATIVE
Nitrite: NEGATIVE
PH: 7 (ref 5.0–8.0)
RBC / HPF: NONE SEEN (ref 0–?)
SPECIFIC GRAVITY, URINE: 1.015 (ref 1.000–1.030)
TOTAL PROTEIN, URINE-UPE24: NEGATIVE
UROBILINOGEN UA: 0.2 (ref 0.0–1.0)
Urine Glucose: NEGATIVE

## 2017-08-30 LAB — CBC WITH DIFFERENTIAL/PLATELET
BASOS PCT: 0.9 % (ref 0.0–3.0)
Basophils Absolute: 0 10*3/uL (ref 0.0–0.1)
Eosinophils Absolute: 0.2 10*3/uL (ref 0.0–0.7)
Eosinophils Relative: 4.1 % (ref 0.0–5.0)
HEMATOCRIT: 44.3 % (ref 39.0–52.0)
HEMOGLOBIN: 15.3 g/dL (ref 13.0–17.0)
LYMPHS PCT: 26.4 % (ref 12.0–46.0)
Lymphs Abs: 1.4 10*3/uL (ref 0.7–4.0)
MCHC: 34.6 g/dL (ref 30.0–36.0)
MCV: 89.8 fl (ref 78.0–100.0)
Monocytes Absolute: 0.7 10*3/uL (ref 0.1–1.0)
Monocytes Relative: 13 % — ABNORMAL HIGH (ref 3.0–12.0)
Neutro Abs: 2.9 10*3/uL (ref 1.4–7.7)
Neutrophils Relative %: 55.6 % (ref 43.0–77.0)
Platelets: 260 10*3/uL (ref 150.0–400.0)
RBC: 4.94 Mil/uL (ref 4.22–5.81)
RDW: 13.8 % (ref 11.5–15.5)
WBC: 5.2 10*3/uL (ref 4.0–10.5)

## 2017-08-30 LAB — HEPATIC FUNCTION PANEL
ALK PHOS: 72 U/L (ref 39–117)
ALT: 31 U/L (ref 0–53)
AST: 21 U/L (ref 0–37)
Albumin: 4.1 g/dL (ref 3.5–5.2)
BILIRUBIN DIRECT: 0.1 mg/dL (ref 0.0–0.3)
BILIRUBIN TOTAL: 0.3 mg/dL (ref 0.2–1.2)
Total Protein: 7.2 g/dL (ref 6.0–8.3)

## 2017-08-30 LAB — LIPID PANEL
CHOL/HDL RATIO: 5
Cholesterol: 233 mg/dL — ABNORMAL HIGH (ref 0–200)
HDL: 47.9 mg/dL (ref >39.00)
LDL Cholesterol: 166 mg/dL — ABNORMAL HIGH (ref 0–99)
NONHDL: 185.41
Triglycerides: 99 mg/dL (ref 0.0–149.0)
VLDL: 19.8 mg/dL (ref 0.0–40.0)

## 2017-08-30 LAB — HEMOGLOBIN A1C: HEMOGLOBIN A1C: 5.8 % (ref 4.6–6.5)

## 2017-08-30 LAB — TSH: TSH: 2.7 u[IU]/mL (ref 0.35–4.50)

## 2017-08-30 LAB — BASIC METABOLIC PANEL
BUN: 12 mg/dL (ref 6–23)
CALCIUM: 9.3 mg/dL (ref 8.4–10.5)
CO2: 26 mEq/L (ref 19–32)
CREATININE: 0.99 mg/dL (ref 0.40–1.50)
Chloride: 104 mEq/L (ref 96–112)
GFR: 87.13 mL/min (ref >60.00)
GLUCOSE: 123 mg/dL — AB (ref 70–99)
Potassium: 4.1 mEq/L (ref 3.5–5.1)
Sodium: 138 mEq/L (ref 135–145)

## 2017-08-30 LAB — PSA: PSA: 0.41 ng/mL (ref 0.10–4.00)

## 2017-08-30 MED ORDER — AMLODIPINE BESYLATE 10 MG PO TABS: 10.0000 mg | ORAL_TABLET | Freq: Every day | ORAL | 3 refills | Status: DC

## 2017-08-30 MED ORDER — LOSARTAN POTASSIUM 100 MG PO TABS: 100.0000 mg | ORAL_TABLET | Freq: Every day | ORAL | 3 refills | Status: DC

## 2017-08-30 MED ORDER — ALBUTEROL SULFATE HFA 108 (90 BASE) MCG/ACT IN AERS: 2.0000 | INHALATION_SPRAY | Freq: Four times a day (QID) | RESPIRATORY_TRACT | 3 refills | Status: DC | PRN

## 2017-08-30 NOTE — Progress Notes (Signed)
Subjective:    Patient ID: Bryan Barber, male    DOB: March 30, 1971, 47 y.o.   MRN: [id number]  HPI  Here for wellness and f/u;  Overall doing ok;  Pt denies Chest pain, worsening SOB, DOE, wheezing, orthopnea, PND, worsening LE edema, palpitations, dizziness or syncope.  Pt denies neurological change such as new headache, facial or extremity weakness.  Pt denies polydipsia, polyuria, or low sugar symptoms. Pt states overall good compliance with treatment and medications, good tolerability, and has been trying to follow appropriate diet.  Pt denies worsening depressive symptoms, suicidal ideation or panic. No fever, night sweats, wt loss, loss of appetite, or other constitutional symptoms.  Pt states good ability with ADL's, has low fall risk, home safety reviewed and adequate, no other significant changes in hearing or vision, and not active with exercise, a nd has noticed some increased wt since his job has changed to more sit down work.  Lost 50 lbs 2 yrs ago and avoided gastric bypass but now regained, and BP and elev HR occas have been more of an issue.  Did see pulm about 1 month ago, PFT's c/w asthma, not sure if sleep apnea still an issue or getting worse.  Good complinace with CPAP, also uses provigil for sleepiness, though now more working daytime hours.  Did [facility] but contrave has not helped.  Also wondering about heart issus but had to cancel cardiology appt due to travel for work. Also c/o indicental worsening umbilical hernia pain, which is mild overall but now nearly constant, and none after can push it in, but is out more than in .Denies worsening reflux, other abd pain, dysphagia, n/v, bowel change or blood.  Wt Readings from Last 3 Encounters:  08/30/17 267 lb (121.1 kg)  08/02/17 264 lb (119.7 kg)  05/20/17 259 lb (117.5 kg)   Past Medical History:  Diagnosis Date  . Allergic rhinitis   . Asthma   . EE (eosinophilic esophagitis) 0/93/2671  . GERD  (gastroesophageal reflux disease) 08/20/2011  . HLD (hyperlipidemia)   . Hypertension   . Multiple gastric polyps 08/20/2011  . Obesity   . OSA on CPAP    Past Surgical History:  Procedure Laterality Date  . ESOPHAGOGASTRODUODENOSCOPY (EGD) WITH ESOPHAGEAL DILATION  08/20/2011  . WISDOM TOOTH EXTRACTION    . WRIST SURGERY  10/2010   cyst removed. Right wrist    reports that he quit smoking about 22 years ago. His smoking use included cigarettes. He has a 10.00 pack-year smoking history. He has never used smokeless tobacco. He reports that he does not drink alcohol or use drugs. family history includes Cancer in his maternal grandmother; Heart disease in his paternal grandfather; Hypertension in his father; Pancreatic cancer in his maternal grandfather; Sleep apnea in his mother. Allergies  Allergen Reactions  . Aspirin     Face swelling    Current Outpatient Medications on File Prior to Visit  Medication Sig Dispense Refill  . albuterol (PROAIR HFA) 108 (90 Base) MCG/ACT inhaler Inhale 2 puffs into the lungs every 6 (six) hours as needed. 3 Inhaler 1  . amLODipine (NORVASC) 10 MG tablet Take 1 tablet (10 mg total) by mouth daily. 30 tablet 0  . Fluticasone-Salmeterol (ADVAIR DISKUS) 250-50 MCG/DOSE AEPB Inhale 1 puff into the lungs 2 (two) times daily. 1 each 5  . losartan (COZAAR) 100 MG tablet Take 1 tablet (100 mg total) by mouth daily. 90 tablet 1  . modafinil (PROVIGIL) 200  MG tablet Take 2 tablets (400 mg total) by mouth daily. 180 tablet 3   No current facility-administered medications on file prior to visit.    Review of Systems Constitutional: Negative for other unusual diaphoresis, sweats, appetite or weight changes HENT: Negative for other worsening hearing loss, ear pain, facial swelling, mouth sores or neck stiffness.   Eyes: Negative for other worsening pain, redness or other visual disturbance.  Respiratory: Negative for other stridor or swelling Cardiovascular:  Negative for other palpitations or other chest pain  Gastrointestinal: Negative for worsening diarrhea or loose stools, blood in stool, distention or other pain Genitourinary: Negative for hematuria, flank pain or other change in urine volume.  Musculoskeletal: Negative for myalgias or other joint swelling.  Skin: Negative for other color change, or other wound or worsening drainage.  Neurological: Negative for other syncope or numbness. Hematological: Negative for other adenopathy or swelling Psychiatric/Behavioral: Negative for hallucinations, other worsening agitation, SI, self-injury, or new decreased concentration All other system neg per pt    Objective:   Physical Exam BP 128/86   Pulse (!) 110   Temp 98.3 F (36.8 C) (Oral)   Ht 5\' 9"  (1.753 m)   Wt 267 lb (121.1 kg)   SpO2 97%   BMI 39.43 kg/m  VS noted, morbid obese  Constitutional: Pt is oriented to person, place, and time. Appears well-developed and well-nourished, in no significant distress and comfortable Head: Normocephalic and atraumatic  Eyes: Conjunctivae and EOM are normal. Pupils are equal, round, and reactive to light Right Ear: External ear normal without discharge Left Ear: External ear normal without discharge Nose: Nose without discharge or deformity Mouth/Throat: Oropharynx is without other ulcerations and moist  Neck: Normal range of motion. Neck supple. No JVD present. No tracheal deviation present or significant neck LA or mass Cardiovascular: Normal rate, regular rhythm, normal heart sounds and intact distal pulses.  Pulmonary/Chest: WOB normal and breath sounds without rales or wheezing  Abdominal: Soft. Bowel sounds are normal. No HSM but has mod sized reducible tender umbilical herniation Musculoskeletal: Normal range of motion. Exhibits no edema Lymphadenopathy: Has no other cervical adenopathy.  Neurological: Pt is alert and oriented to person, place, and time. Pt has normal reflexes. No cranial  nerve deficit. Motor grossly intact, Gait intact Skin: Skin is warm and dry. No rash noted or new ulcerations Psychiatric:  Has nervous slightly tearful mood and affect. Behavior is normal without agitation No other exam findings  ECG today I have personally interpreted NSR - 99, no ischemic changes  CHEST  2 VIEW COMPARISON:  Radiographs of May 16, 2016. FINDINGS: The heart size and mediastinal contours are within normal limits. No pneumothorax or pleural effusion is noted. Stable left basilar scarring is noted. No acute pulmonary disease is noted. The visualized skeletal structures are unremarkable. IMPRESSION: No active cardiopulmonary disease.      Assessment & Plan:

## 2017-08-30 NOTE — Patient Instructions (Addendum)
Your ECG did show the elevated heart rate today, but otherwise OK  Please continue all other medications as before, and refills have been done if requested - the amlodipine, losartan and albuterol inhaler  Please have the pharmacy call with any other refills you may need.  Please continue your efforts at being more active, low cholesterol diet, and weight control.  You are otherwise up to date with prevention measures today.  Please keep your appointments with your specialists as you may have planned  You will be contacted regarding the referral for: Echocardiogram, and Cardiology, as well as General Surgury for the hernia  Please go to the LAB in the Basement (turn left off the elevator) for the tests to be done today  You will be contacted by phone if any changes need to be made immediately.  Otherwise, you will receive a letter about your results with an explanation, but please check with MyChart first.  Please remember to sign up for MyChart if you have not done so, as this will be important to you in the future with finding out test results, communicating by private email, and scheduling acute appointments online when needed.  Please return in 6 months, or sooner if needed

## 2017-09-01 ENCOUNTER — Encounter: Payer: Self-pay | Admitting: Internal Medicine

## 2017-09-01 NOTE — Assessment & Plan Note (Signed)
Lab Results  Component Value Date   HGBA1C 5.8 08/30/2017  stable overall by history and exam, recent data reviewed with pt, and pt to continue medical treatment as before,  to f/u any worsening symptoms or concerns

## 2017-09-01 NOTE — Assessment & Plan Note (Signed)
stable overall by history and exam, recent data reviewed with pt, and pt to continue medical treatment as before,  to f/u any worsening symptoms or concerns BP Readings from Last 3 Encounters:  08/30/17 128/86  08/02/17 122/80  05/20/17 136/86

## 2017-09-01 NOTE — Assessment & Plan Note (Signed)

## 2017-09-01 NOTE — Assessment & Plan Note (Signed)
Symptomatic and tender but reducible on exam, for general surgury referral

## 2017-09-01 NOTE — Assessment & Plan Note (Addendum)
ecg reveiwed, inapropriate rate persistent, for echo, cardiology referral  In addition to the time spent performing CPE, I spent an additional 15 minutes face to face,in which greater than 50% of this time was spent in counseling and coordination of care for patient's illness as documented, including the differential dx, treatment, further evaluation and other management of tachycardia, umbilical hernia HTN, and hyperglycemia

## 2017-09-09 ENCOUNTER — Other Ambulatory Visit: Payer: Self-pay

## 2017-09-09 ENCOUNTER — Ambulatory Visit (HOSPITAL_COMMUNITY): Payer: BLUE CROSS/BLUE SHIELD | Attending: Internal Medicine

## 2017-09-09 DIAGNOSIS — I5189 Other ill-defined heart diseases: Secondary | ICD-10-CM | POA: Insufficient documentation

## 2017-09-09 DIAGNOSIS — R9431 Abnormal electrocardiogram [ECG] [EKG]: Secondary | ICD-10-CM | POA: Diagnosis not present

## 2017-09-09 DIAGNOSIS — R000 Tachycardia, unspecified: Secondary | ICD-10-CM

## 2017-09-09 DIAGNOSIS — R0600 Dyspnea, unspecified: Secondary | ICD-10-CM | POA: Diagnosis not present

## 2017-10-04 ENCOUNTER — Ambulatory Visit: Payer: BLUE CROSS/BLUE SHIELD | Admitting: Pulmonary Disease

## 2017-10-25 DIAGNOSIS — G4733 Obstructive sleep apnea (adult) (pediatric): Secondary | ICD-10-CM | POA: Diagnosis not present

## 2017-10-29 ENCOUNTER — Telehealth: Payer: Self-pay | Admitting: Pulmonary Disease

## 2017-10-29 DIAGNOSIS — G4733 Obstructive sleep apnea (adult) (pediatric): Secondary | ICD-10-CM

## 2017-10-29 DIAGNOSIS — Z9989 Dependence on other enabling machines and devices: Principal | ICD-10-CM

## 2017-10-29 NOTE — Telephone Encounter (Signed)
Please change auto CPAP range to 5 - 20 cm H2O.

## 2017-10-29 NOTE — Telephone Encounter (Signed)
Called spoke to patient and let him know the settings change. Patient thanked staff for working on this. Nothing further needed at this time.

## 2017-10-29 NOTE — Telephone Encounter (Signed)
Called and spoke to patient. Patient stated that he feels like the pressures on his new machine are too low. Patient reported that he has been having more difficulty sleeping soundly and has been snoring more. Patient would like to have his pressures adjusted.  VS please advise.

## 2017-10-29 NOTE — Telephone Encounter (Signed)
So then send order to change CPAP to 13 cm H2O.

## 2017-10-29 NOTE — Telephone Encounter (Signed)
Called patient and relayed information about changing auto settings to 5-20. Patient stated, "That's not going to help. My issue is the 5 is too low. I did better when the low number was 12.5."  Patient continued to state that he needs his pressure to be closer to 13. Patient was adamant that the lower setting is what needs to be adjusted.  VS please advise.

## 2017-10-30 ENCOUNTER — Telehealth: Payer: Self-pay | Admitting: Internal Medicine

## 2017-10-30 MED ORDER — ROSUVASTATIN CALCIUM 20 MG PO TABS: 20.0000 mg | ORAL_TABLET | Freq: Every day | ORAL | 3 refills | Status: DC

## 2017-10-30 NOTE — Telephone Encounter (Signed)
Copied from [place] [phone]. Topic: General - Other >> Oct 30, 2017  8:42 AM Lennox Solders wrote: Reason for CRM: pt is calling and  had a chole test  on 10-25-17 through his job. Pt chole  218 and ratio 4.6, ldl 152 and hdl 47 and triglycerides 86 and glucose 110[facility] [place] [facility] Pt would like to get on chole med.

## 2017-10-30 NOTE — Telephone Encounter (Signed)
Called pt, LVM with the details below. 

## 2017-10-30 NOTE — Telephone Encounter (Signed)
[place] for the best one - crestor 20 mg per day  Please let pt know, if this is not covered by his insurance, please have pharmacy call with an alternative that would be similar and covered

## 2017-11-13 ENCOUNTER — Telehealth: Payer: Self-pay | Admitting: Pulmonary Disease

## 2017-11-13 NOTE — Telephone Encounter (Signed)
[facility] Pt's insurance is not covering Modafinil. PA was inititated on Cover My Meds. Key: [id number]. PA was approved from 10/14/17 - 11/13/18. I have contacted the pt's pharmacy. Nothing further was needed.

## 2017-11-14 NOTE — Progress Notes (Signed)
Cardiology Office Note   Date:  11/15/2017   ID:  Bryan Barber, DOB 1970/11/24, MRN [id number]  PCP:  Biagio Borg, MD  Cardiologist:   Pixie Casino, MD   Chief Complaint  Patient presents with  . Hypertension  . Dyslipidemia      History of Present Illness: Bryan Barber is a 47 y.o. male who is referred by Dr. [name] for evaluation of HTN and hyperlipidemia. He has had HTN for years.  Over the last few months he had Norvasc increased and Cozaar added.  He had an echo in April with a normal EF and no significant abnormalities.  He has had a problem with his weight for years.  He has dieted and lost weight.  He is been a bariatric program but did not have to have surgery and is going back to that program.  He currently has a sedentary job.  He is watched his weight go up because of this and because he is traveling and eating out quite a bit.  With his current level of activity he denies any cardiovascular symptoms.  He denies any new shortness of breath, PND or orthopnea.  He said no palpitations, presyncope or syncope.  He had no chest pressure, neck or arm discomfort.   Past Medical History:  Diagnosis Date  . Allergic rhinitis   . Asthma   . EE (eosinophilic esophagitis) 0/30/0923  . GERD (gastroesophageal reflux disease) 08/20/2011  . HLD (hyperlipidemia)   . Hypertension   . Multiple gastric polyps 08/20/2011  . Obesity   . OSA on CPAP     Past Surgical History:  Procedure Laterality Date  . ESOPHAGOGASTRODUODENOSCOPY (EGD) WITH ESOPHAGEAL DILATION  08/20/2011  . WISDOM TOOTH EXTRACTION    . WRIST SURGERY  10/2010   cyst removed. Right wrist     Current Outpatient Medications  Medication Sig Dispense Refill  . albuterol (PROAIR HFA) 108 (90 Base) MCG/ACT inhaler Inhale 2 puffs into the lungs every 6 (six) hours as needed. 3 Inhaler 3  . amLODipine (NORVASC) 10 MG tablet Take 1 tablet (10 mg total) by mouth daily. 90 tablet 3  . Fluticasone-Salmeterol (ADVAIR  DISKUS) 250-50 MCG/DOSE AEPB Inhale 1 puff into the lungs 2 (two) times daily. 1 each 5  . losartan (COZAAR) 100 MG tablet Take 1 tablet (100 mg total) by mouth daily. 90 tablet 3  . modafinil (PROVIGIL) 200 MG tablet Take 2 tablets (400 mg total) by mouth daily. 180 tablet 3  . rosuvastatin (CRESTOR) 20 MG tablet Take 1 tablet (20 mg total) by mouth daily. 90 tablet 3   No current facility-administered medications for this visit.     Allergies:   Aspirin    ROS:  Please see the history of present illness.   Otherwise, review of systems are positive for none.   All other systems are reviewed and negative.    PHYSICAL EXAM: VS:  BP 128/84   Pulse 96   Ht 5\' 9"  (1.753 m)   Wt 275 lb 6.4 oz (124.9 kg)   BMI 40.67 kg/m  , BMI Body mass index is 40.67 kg/m. GENERAL:  Well appearing HEENT:  Pupils equal round and reactive, fundi not visualized, oral mucosa unremarkable NECK:  No jugular venous distention, waveform within normal limits, carotid upstroke brisk and symmetric, no bruits, no thyromegaly LYMPHATICS:  No cervical, inguinal adenopathy LUNGS:  Clear to auscultation bilaterally BACK:  No CVA tenderness CHEST:  Unremarkable HEART:  PMI  not displaced or sustained,S1 and S2 within normal limits, no S3, no S4, no clicks, no rubs, no murmurs ABD:  Flat, positive bowel sounds normal in frequency in pitch, no bruits, no rebound, no guarding, no midline pulsatile mass, no hepatomegaly, no splenomegaly EXT:  2 plus pulses throughout, no edema, no cyanosis no clubbing SKIN:  No rashes no nodules NEURO:  Cranial nerves II through XII grossly intact, motor grossly intact throughout PSYCH:  Cognitively intact, oriented to person place and time    EKG:  EKG is ordered today. The ekg ordered 11/15/2017 demonstrates sinus rhythm, rate 96, axis within normal limits, intervals within normal limits, no acute ST-T wave changes   Recent Labs: 05/20/2017: Pro B Natriuretic peptide (BNP)  5.0 08/30/2017: ALT 31; BUN 12; Creatinine, Ser 0.99; Hemoglobin 15.3; Platelets 260.0; Potassium 4.1; Sodium 138; TSH 2.70    Lipid Panel    Component Value Date/Time   CHOL 233 (H) 08/30/2017 0914   TRIG 99.0 08/30/2017 0914   HDL 47.90 08/30/2017 0914   CHOLHDL 5 08/30/2017 0914   VLDL 19.8 08/30/2017 0914   LDLCALC 166 (H) 08/30/2017 0914   LDLDIRECT 150.1 08/08/2006 1259      Wt Readings from Last 3 Encounters:  11/15/17 275 lb 6.4 oz (124.9 kg)  08/30/17 267 lb (121.1 kg)  08/02/17 264 lb (119.7 kg)      Other studies Reviewed: Additional studies/ records that were reviewed today include: Labs. Review of the above records demonstrates:  Please see elsewhere in the note.     ASSESSMENT AND PLAN:  HTN:   The blood pressure is at target. No change in medications is indicated. We will continue with therapeutic lifestyle changes (TLC).  I think this is related to weight and his sleep apnea and will come down as his weight and activity level improves.  He can continue the current dose of medications.  DYSLIPIDEMIA: His LDL is as above.  His Framingham score calculates to be 7.9.  This could be an indication for him to take a statin.  However, at this point recommendation is to allow the patient to have activation and education for decision-making.  We talked about a coronary calcium score which could help make a decision on the use of statin or not and he prefers not to do this.  He prefers not to take the medication but rather will pursue aggressive lifestyle changes and then repeat lipid profile in about 6 months.  I think this is a very reasonable approach.  OBESITY:  The patient understands the need to lose weight with diet and exercise. We have discussed specific strategies for this.  He will follow-up with bariatrics and we talked a lot about diet   Current medicines are reviewed at length with the patient today.  The patient does not have concerns regarding  medicines.  The following changes have been made:  no change  Labs/ tests ordered today include: None  Orders Placed This Encounter  Procedures  . EKG 12-Lead     Disposition:   FU with me as needed.      Signed, Minus Breeding, MD  11/15/2017 10:28 AM    [facility]

## 2017-11-15 ENCOUNTER — Encounter

## 2017-11-15 ENCOUNTER — Ambulatory Visit: Payer: BLUE CROSS/BLUE SHIELD | Admitting: Cardiology

## 2017-11-15 ENCOUNTER — Encounter: Payer: Self-pay | Admitting: Cardiology

## 2017-11-15 VITALS — BP 128/84 | HR 96 | Ht 69.0 in | Wt 275.4 lb

## 2017-11-15 DIAGNOSIS — E785 Hyperlipidemia, unspecified: Secondary | ICD-10-CM | POA: Diagnosis not present

## 2017-11-15 DIAGNOSIS — I10 Essential (primary) hypertension: Secondary | ICD-10-CM | POA: Diagnosis not present

## 2017-11-15 NOTE — Patient Instructions (Signed)
Medication Instructions:  Continue current medications  If you need a refill on your cardiac medications before your next appointment, please call your pharmacy.  Labwork: None Ordered  Testing/Procedures: None Ordered  Follow-Up: Your physician wants you to follow-up in: As Needed.      Thank you for choosing [facility]!!       

## 2017-11-21 DIAGNOSIS — Z6841 Body Mass Index (BMI) 40.0 and over, adult: Secondary | ICD-10-CM | POA: Diagnosis not present

## 2017-11-21 DIAGNOSIS — K219 Gastro-esophageal reflux disease without esophagitis: Secondary | ICD-10-CM | POA: Diagnosis not present

## 2017-11-22 DIAGNOSIS — I10 Essential (primary) hypertension: Secondary | ICD-10-CM | POA: Diagnosis not present

## 2017-11-22 DIAGNOSIS — E785 Hyperlipidemia, unspecified: Secondary | ICD-10-CM | POA: Diagnosis not present

## 2017-11-22 DIAGNOSIS — R7303 Prediabetes: Secondary | ICD-10-CM | POA: Diagnosis not present

## 2017-11-25 DIAGNOSIS — G4733 Obstructive sleep apnea (adult) (pediatric): Secondary | ICD-10-CM | POA: Diagnosis not present

## 2017-11-26 DIAGNOSIS — K210 Gastro-esophageal reflux disease with esophagitis: Secondary | ICD-10-CM | POA: Diagnosis not present

## 2017-11-26 DIAGNOSIS — Z01812 Encounter for preprocedural laboratory examination: Secondary | ICD-10-CM | POA: Diagnosis not present

## 2017-11-26 DIAGNOSIS — Z6841 Body Mass Index (BMI) 40.0 and over, adult: Secondary | ICD-10-CM | POA: Diagnosis not present

## 2017-11-26 DIAGNOSIS — K200 Eosinophilic esophagitis: Secondary | ICD-10-CM | POA: Diagnosis not present

## 2017-11-26 DIAGNOSIS — Z713 Dietary counseling and surveillance: Secondary | ICD-10-CM | POA: Diagnosis not present

## 2017-12-04 ENCOUNTER — Telehealth: Payer: Self-pay

## 2017-12-04 ENCOUNTER — Other Ambulatory Visit (INDEPENDENT_AMBULATORY_CARE_PROVIDER_SITE_OTHER): Payer: BLUE CROSS/BLUE SHIELD

## 2017-12-04 ENCOUNTER — Ambulatory Visit: Payer: BLUE CROSS/BLUE SHIELD | Admitting: Internal Medicine

## 2017-12-04 ENCOUNTER — Encounter: Payer: Self-pay | Admitting: Internal Medicine

## 2017-12-04 ENCOUNTER — Other Ambulatory Visit: Payer: Self-pay | Admitting: Internal Medicine

## 2017-12-04 VITALS — BP 124/84 | HR 117 | Temp 99.0°F | Ht 69.0 in | Wt 263.0 lb

## 2017-12-04 DIAGNOSIS — R197 Diarrhea, unspecified: Secondary | ICD-10-CM

## 2017-12-04 DIAGNOSIS — I10 Essential (primary) hypertension: Secondary | ICD-10-CM

## 2017-12-04 DIAGNOSIS — R000 Tachycardia, unspecified: Secondary | ICD-10-CM | POA: Diagnosis not present

## 2017-12-04 DIAGNOSIS — H40003 Preglaucoma, unspecified, bilateral: Secondary | ICD-10-CM | POA: Diagnosis not present

## 2017-12-04 LAB — CBC WITH DIFFERENTIAL/PLATELET
BASOS PCT: 0.4 % (ref 0.0–3.0)
Basophils Absolute: 0 10*3/uL (ref 0.0–0.1)
EOS PCT: 0.2 % (ref 0.0–5.0)
Eosinophils Absolute: 0 10*3/uL (ref 0.0–0.7)
HEMATOCRIT: 46.2 % (ref 39.0–52.0)
Hemoglobin: 16 g/dL (ref 13.0–17.0)
LYMPHS ABS: 0.8 10*3/uL (ref 0.7–4.0)
Lymphocytes Relative: 14.2 % (ref 12.0–46.0)
MCHC: 34.6 g/dL (ref 30.0–36.0)
MCV: 90.4 fl (ref 78.0–100.0)
Monocytes Absolute: 0.9 10*3/uL (ref 0.1–1.0)
Monocytes Relative: 17.8 % — ABNORMAL HIGH (ref 3.0–12.0)
NEUTROS ABS: 3.6 10*3/uL (ref 1.4–7.7)
Neutrophils Relative %: 67.4 % (ref 43.0–77.0)
Platelets: 225 10*3/uL (ref 150.0–400.0)
RBC: 5.11 Mil/uL (ref 4.22–5.81)
RDW: 13.6 % (ref 11.5–15.5)
WBC: 5.3 10*3/uL (ref 4.0–10.5)

## 2017-12-04 LAB — BASIC METABOLIC PANEL
BUN: 11 mg/dL (ref 6–23)
CALCIUM: 9.1 mg/dL (ref 8.4–10.5)
CO2: 23 meq/L (ref 19–32)
Chloride: 101 mEq/L (ref 96–112)
Creatinine, Ser: 1.23 mg/dL (ref 0.40–1.50)
GFR: 67.74 mL/min (ref >60.00)
Glucose, Bld: 112 mg/dL — ABNORMAL HIGH (ref 70–99)
Potassium: 3.4 mEq/L — ABNORMAL LOW (ref 3.5–5.1)
SODIUM: 134 meq/L — AB (ref 135–145)

## 2017-12-04 LAB — HEPATIC FUNCTION PANEL
ALK PHOS: 73 U/L (ref 39–117)
ALT: 20 U/L (ref 0–53)
AST: 13 U/L (ref 0–37)
Albumin: 4.5 g/dL (ref 3.5–5.2)
BILIRUBIN DIRECT: 0.1 mg/dL (ref 0.0–0.3)
BILIRUBIN TOTAL: 0.5 mg/dL (ref 0.2–1.2)
Total Protein: 7.8 g/dL (ref 6.0–8.3)

## 2017-12-04 MED ORDER — POTASSIUM CHLORIDE ER 10 MEQ PO TBCR: 10.0000 meq | EXTENDED_RELEASE_TABLET | Freq: Every day | ORAL | 0 refills | Status: DC

## 2017-12-04 MED ORDER — METRONIDAZOLE 250 MG PO TABS: 250.0000 mg | ORAL_TABLET | Freq: Three times a day (TID) | ORAL | 0 refills | Status: AC

## 2017-12-04 MED ORDER — DIPHENOXYLATE-ATROPINE 2.5-0.025 MG PO TABS
1.0000 | ORAL_TABLET | Freq: Four times a day (QID) | ORAL | 0 refills | Status: DC | PRN
Start: 2017-12-04 — End: 2018-02-28

## 2017-12-04 NOTE — Telephone Encounter (Signed)
Pt has viewed results via MyChart  

## 2017-12-04 NOTE — Patient Instructions (Signed)
Please take all new medication as prescribed - the flagyl and lomotil as needed  Please let us know if you change your mind about the stool testing  Please continue all other medications as before, and refills have been done if requested.  Please have the pharmacy call with any other refills you may need.  Please keep your appointments with your specialists as you may have planned  Please go to the LAB in the Basement (turn left off the elevator) for the tests to be done today  You will be contacted by phone if any changes need to be made immediately.  Otherwise, you will receive a letter about your results with an explanation, but please check with MyChart first.  Please remember to sign up for MyChart if you have not done so, as this will be important to you in the future with finding out test results, communicating by private email, and scheduling acute appointments online when needed.

## 2017-12-04 NOTE — Assessment & Plan Note (Signed)
Mild to mod, c/w possible AGE vs colitis, declines stool testing, for labs as ordered, also empiric lomotil if labs ok, flagyl but consider add cipro if worsens, drink more fluids, tylenol prn

## 2017-12-04 NOTE — Assessment & Plan Note (Signed)
Improved recently, now elevated again likely situational, for pushed oral fluids,  to f/u any worsening symptoms or concerns

## 2017-12-04 NOTE — Assessment & Plan Note (Signed)
stable overall by history and exam, recent data reviewed with pt, and pt to continue medical treatment as before,  to f/u any worsening symptoms or concerns Lab Results  Component Value Date   HGBA1C 5.8 08/30/2017

## 2017-12-04 NOTE — Telephone Encounter (Signed)
-----   Message from Biagio Borg, MD sent at 12/04/2017  3:41 PM EDT ----- Left message on MyChart, pt to cont same tx except  The test results show that your current treatment is OK, except the potassium is slightly low.  This is most likely due to the diarrhea.  The white blood cells are normal.  We will send several days of potassium to the pharmacy, but.  There is no other need for change of treatment or further evaluation based on these results, at this time.    Phinley Schall to please inform pt, I will do rx

## 2017-12-04 NOTE — Progress Notes (Signed)
Subjective:    Patient ID: Bryan Barber, male    DOB: [birth year], 47 y.o.   MRN: [id number]  HPI  Her with 3 days onset crampy tightness abd pain and relatively large volume watery diarrhea, sort of darker color, no blood or melena, has been assoc with diffuse type of pain, body and myalgias aches with some pain radiation to the legs, low grade temperature, gas and gurgling,  but no n/v.  Appetite lower, lost wt and not urinating as much and darker urine. Marland Kitchen Has been in bed mostly, may have had 10 episodes yesterday, some improved today overall but still 3 episodes.  Has some tachycardia today but none with card visit and Echo April 2019 normal EF. No sick contacts.or change in diet.  Wife did not eat what he did, not otherwise having any trouble.  Did have breathing test negative for H pylori last wk per pt Past Medical History:  Diagnosis Date  . Allergic rhinitis   . Asthma   . EE (eosinophilic esophagitis) 0/16/0109  . GERD (gastroesophageal reflux disease) 08/20/2011  . HLD (hyperlipidemia)   . Hypertension   . Multiple gastric polyps 08/20/2011  . Obesity   . OSA on CPAP    Past Surgical History:  Procedure Laterality Date  . ESOPHAGOGASTRODUODENOSCOPY (EGD) WITH ESOPHAGEAL DILATION  08/20/2011  . WISDOM TOOTH EXTRACTION    . WRIST SURGERY  10/2010   cyst removed. Right wrist    reports that he quit smoking about 22 years ago. His smoking use included cigarettes. He has a 10.00 pack-year smoking history. He has never used smokeless tobacco. He reports that he does not drink alcohol or use drugs. family history includes Atrial fibrillation in his father; Cancer in his maternal grandmother; Heart disease in his paternal grandfather; Hypertension in his father; Pancreatic cancer in his maternal grandfather; Sleep apnea in his mother. Allergies  Allergen Reactions  . Aspirin     Face swelling    Current Outpatient Medications on File Prior to Visit  Medication Sig Dispense Refill  .  albuterol (PROAIR HFA) 108 (90 Base) MCG/ACT inhaler Inhale 2 puffs into the lungs every 6 (six) hours as needed. 3 Inhaler 3  . amLODipine (NORVASC) 10 MG tablet Take 1 tablet (10 mg total) by mouth daily. 90 tablet 3  . Fluticasone-Salmeterol (ADVAIR DISKUS) 250-50 MCG/DOSE AEPB Inhale 1 puff into the lungs 2 (two) times daily. 1 each 5  . losartan (COZAAR) 100 MG tablet Take 1 tablet (100 mg total) by mouth daily. 90 tablet 3  . modafinil (PROVIGIL) 200 MG tablet Take 2 tablets (400 mg total) by mouth daily. 180 tablet 3  . rosuvastatin (CRESTOR) 20 MG tablet Take 1 tablet (20 mg total) by mouth daily. 90 tablet 3   No current facility-administered medications on file prior to visit.    Review of Systems  Constitutional: Negative for other unusual diaphoresis or sweats HENT: Negative for ear discharge or swelling Eyes: Negative for other worsening visual disturbances Respiratory: Negative for stridor or other swelling  Gastrointestinal: Negative for worsening distension or other blood Genitourinary: Negative for retention or other urinary change Musculoskeletal: Negative for other MSK pain or swelling Skin: Negative for color change or other new lesions Neurological: Negative for worsening tremors and other numbness  Psychiatric/Behavioral: Negative for worsening agitation or other fatigue All other system neg per pt    Objective:   Physical Exam BP 124/84   Pulse (!) 117   Temp 99 F (37.2  C) (Oral)   Ht 5\' 9"  (1.753 m)   Wt 263 lb (119.3 kg)   SpO2 99%   BMI 38.84 kg/m  VS noted, mild ill appearing, nervous Constitutional: Pt appears in NAD HENT: Head: NCAT.  Right Ear: External ear normal.  Left Ear: External ear normal.  Eyes: . Pupils are equal, round, and reactive to light. Conjunctivae and EOM are normal Nose: without d/c or deformity Neck: Neck supple. Gross normal ROM Cardiovascular: Normal rate and regular rhythm.   Pulmonary/Chest: Effort normal and breath  sounds without rales or wheezing.  Abd:  Soft, very mild diffuse tender and slight distended, + borborygmi frequent, + BS, no organomegaly Neurological: Pt is alert. At baseline orientation, motor grossly intact Skin: Skin is warm. No rashes, other new lesions, no LE edema Psychiatric: Pt behavior is normal without agitation  No other exam findings Lab Results  Component Value Date   WBC 5.2 08/30/2017   HGB 15.3 08/30/2017   HCT 44.3 08/30/2017   PLT 260.0 08/30/2017   GLUCOSE 123 (H) 08/30/2017   CHOL 233 (H) 08/30/2017   TRIG 99.0 08/30/2017   HDL 47.90 08/30/2017   LDLDIRECT 150.1 08/08/2006   LDLCALC 166 (H) 08/30/2017   ALT 31 08/30/2017   AST 21 08/30/2017   NA 138 08/30/2017   K 4.1 08/30/2017   CL 104 08/30/2017   CREATININE 0.99 08/30/2017   BUN 12 08/30/2017   CO2 26 08/30/2017   TSH 2.70 08/30/2017   PSA 0.41 08/30/2017   HGBA1C 5.8 08/30/2017       Assessment & Plan:

## 2017-12-23 DIAGNOSIS — Z713 Dietary counseling and surveillance: Secondary | ICD-10-CM | POA: Diagnosis not present

## 2017-12-23 DIAGNOSIS — E669 Obesity, unspecified: Secondary | ICD-10-CM | POA: Diagnosis not present

## 2017-12-23 DIAGNOSIS — R7303 Prediabetes: Secondary | ICD-10-CM | POA: Diagnosis not present

## 2017-12-23 DIAGNOSIS — Z6837 Body mass index (BMI) 37.0-37.9, adult: Secondary | ICD-10-CM | POA: Diagnosis not present

## 2017-12-23 DIAGNOSIS — I10 Essential (primary) hypertension: Secondary | ICD-10-CM | POA: Diagnosis not present

## 2017-12-25 DIAGNOSIS — G4733 Obstructive sleep apnea (adult) (pediatric): Secondary | ICD-10-CM | POA: Diagnosis not present

## 2018-01-17 DIAGNOSIS — E669 Obesity, unspecified: Secondary | ICD-10-CM | POA: Diagnosis not present

## 2018-01-17 DIAGNOSIS — R7303 Prediabetes: Secondary | ICD-10-CM | POA: Diagnosis not present

## 2018-01-17 DIAGNOSIS — I10 Essential (primary) hypertension: Secondary | ICD-10-CM | POA: Diagnosis not present

## 2018-01-17 DIAGNOSIS — Z6837 Body mass index (BMI) 37.0-37.9, adult: Secondary | ICD-10-CM | POA: Diagnosis not present

## 2018-02-19 ENCOUNTER — Other Ambulatory Visit: Payer: Self-pay | Admitting: Pulmonary Disease

## 2018-02-21 ENCOUNTER — Telehealth: Payer: Self-pay | Admitting: Pulmonary Disease

## 2018-02-21 ENCOUNTER — Telehealth: Payer: Self-pay

## 2018-02-21 NOTE — Telephone Encounter (Signed)
Called patient and made him aware we have sent this in to the pharmacy today. Nothing further needed at this time.

## 2018-02-21 NOTE — Telephone Encounter (Signed)
Spoke with patient, patient went to pick up medication and it was not at pharmacy. [facility] [address], they never received the order. Patient has to be in [place] this afternoon and requests that [facility] to [facility] [place] [place]. [facility] on [address] that they did not receive med and patient did not pick up. [facility] [place]. Made patient aware. Nothing further needed at this time.

## 2018-02-28 ENCOUNTER — Ambulatory Visit: Payer: BLUE CROSS/BLUE SHIELD | Admitting: Family

## 2018-02-28 ENCOUNTER — Other Ambulatory Visit (INDEPENDENT_AMBULATORY_CARE_PROVIDER_SITE_OTHER): Payer: BLUE CROSS/BLUE SHIELD

## 2018-02-28 ENCOUNTER — Encounter: Payer: Self-pay | Admitting: Family

## 2018-02-28 VITALS — BP 130/80 | HR 86 | Temp 97.9°F | Ht 69.0 in | Wt 243.8 lb

## 2018-02-28 DIAGNOSIS — Z205 Contact with and (suspected) exposure to viral hepatitis: Secondary | ICD-10-CM | POA: Diagnosis not present

## 2018-02-28 DIAGNOSIS — Z23 Encounter for immunization: Secondary | ICD-10-CM

## 2018-02-28 LAB — HEPATIC FUNCTION PANEL
ALBUMIN: 4.4 g/dL (ref 3.5–5.2)
ALT: 38 U/L (ref 0–53)
AST: 23 U/L (ref 0–37)
Alkaline Phosphatase: 71 U/L (ref 39–117)
Bilirubin, Direct: 0.1 mg/dL (ref 0.0–0.3)
TOTAL PROTEIN: 7.7 g/dL (ref 6.0–8.3)
Total Bilirubin: 0.7 mg/dL (ref 0.2–1.2)

## 2018-02-28 NOTE — Progress Notes (Signed)
Bryan Barber is a 47 y.o. male with the following history as recorded in EpicCare:  Patient Active Problem List   Diagnosis Date Noted  . Diarrhea 12/04/2017  . Encounter for well adult exam with abnormal findings 08/30/2017  . Tachycardia 08/30/2017  . Umbilical hernia 94/85/4627  . Hyperglycemia 08/30/2017  . Carpal tunnel syndrome 08/23/2016  . Hypersomnia with sleep apnea 07/30/2016  . Jaw pain 12/05/2015  . Stye 12/05/2015  . Shift work sleep disorder 09/12/2015  . Essential hypertension 09/12/2015  . Obesity, Class II, BMI 35-39.9, with comorbidity 09/21/2013  . BMI 36.0-36.9,adult 10/22/2011  . Seasonal allergic rhinitis 10/08/2011  . Eosinophilic esophagitis 03/50/0938  .  GERD - erosive esophagitis 08/13/2011  . Inadequate sleep hygiene 11/21/2010  . HYPERLIPIDEMIA 01/25/2009  . OBSESSIVE-COMPULSIVE DISORDER 01/25/2009  . Obstructive sleep apnea 10/14/2007  . Asthma 07/22/2007    Current Outpatient Medications  Medication Sig Dispense Refill  . albuterol (PROAIR HFA) 108 (90 Base) MCG/ACT inhaler Inhale 2 puffs into the lungs every 6 (six) hours as needed. 3 Inhaler 3  . amLODipine (NORVASC) 10 MG tablet Take 1 tablet (10 mg total) by mouth daily. 90 tablet 3  . Fluticasone-Salmeterol (ADVAIR DISKUS) 250-50 MCG/DOSE AEPB Inhale 1 puff into the lungs 2 (two) times daily. 1 each 5  . LOMAIRA 8 MG TABS TAKE 1 TABLET BY MOUTH IN THE MORNING AND AGAIN AROUND 1-2PM  0  . losartan (COZAAR) 100 MG tablet Take 100 mg by mouth daily.    . modafinil (PROVIGIL) 200 MG tablet TAKE 2 TABLETS EVERY DAY 180 tablet 3   No current facility-administered medications for this visit.     Allergies: Aspirin  Past Medical History:  Diagnosis Date  . Allergic rhinitis   . Asthma   . EE (eosinophilic esophagitis) 1/82/9937  . GERD (gastroesophageal reflux disease) 08/20/2011  . HLD (hyperlipidemia)   . Hypertension   . Multiple gastric polyps 08/20/2011  . Obesity   . OSA on CPAP      Past Surgical History:  Procedure Laterality Date  . ESOPHAGOGASTRODUODENOSCOPY (EGD) WITH ESOPHAGEAL DILATION  08/20/2011  . WISDOM TOOTH EXTRACTION    . WRIST SURGERY  10/2010   cyst removed. Right wrist    Family History  Problem Relation Age of Onset  . Hypertension Father   . Atrial fibrillation Father   . Sleep apnea Mother   . Heart disease Paternal Grandfather   . Pancreatic cancer Maternal Grandfather   . Cancer Maternal Grandmother   . Colon cancer Neg Hx     Social History   Tobacco Use  . Smoking status: Former Smoker    Packs/day: 1.00    Years: 10.00    Pack years: 10.00    Types: Cigarettes    Last attempt to quit: 06/05/1995    Years since quitting: 22.7  . Smokeless tobacco: Never Used  Substance Use Topics  . 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  Head: Normocephalic and atraumatic  Eyes: Sclera and conjunctiva clear; pupils round and reactive to light; extraocular movements intact  Ears: External normal; canals clear; tympanic membranes normal  Oropharynx: Pink, supple. No suspicious lesions  Neck: Supple without thyromegaly, adenopathy  Lungs: Respirations unlabored; Abdomen: Soft; nontender; nondistended; normoactive bowel sounds; no masses or hepatosplenomegaly  Musculoskeletal: No deformities; no active joint  inflammation  Neurologic: Alert and oriented; speech intact; face symmetrical; moves all extremities well; CNII-XII intact without focal deficit   Assessment:  1. Exposure to hepatitis B   2. Need for influenza vaccination     Plan:  Will update hepatitis screens today; if no immunity, patient will start vaccines for Hep A and B; follow-up to be determined. Flu vaccine updated.   No follow-ups on file.  Orders Placed This Encounter  Procedures  . Flu Vaccine QUAD 36+ mos IM  . Hepatitis, Acute    Standing Status:   Future    Number of Occurrences:   1    Standing Expiration Date:   02/28/2019  . Hepatitis B Surface AntiGEN    Standing Status:   Future    Number of Occurrences:   1    Standing Expiration Date:   02/28/2019  . Hepatitis B Surface AntiBODY    Standing Status:   Future    Number of Occurrences:   1    Standing Expiration Date:   02/28/2019  . Hepatic function panel    Standing Status:   Future    Number of Occurrences:   1    Standing Expiration Date:   02/28/2019    Requested Prescriptions    No prescriptions requested or ordered in this encounter

## 2018-03-01 LAB — HEPATITIS PANEL, ACUTE
HEP A IGM: NONREACTIVE
HEP B S AG: NONREACTIVE
HEP C AB: NONREACTIVE
Hep B C IgM: NONREACTIVE
SIGNAL TO CUT-OFF: 0.02 (ref <1.00)

## 2018-03-01 LAB — HEPATITIS B SURFACE ANTIBODY,QUALITATIVE: Hep B S Ab: REACTIVE — AB

## 2018-03-07 ENCOUNTER — Ambulatory Visit: Payer: BLUE CROSS/BLUE SHIELD | Admitting: Internal Medicine

## 2018-05-05 ENCOUNTER — Other Ambulatory Visit: Payer: Self-pay

## 2018-05-05 MED ORDER — FLUTICASONE-SALMETEROL 250-50 MCG/DOSE IN AEPB: 1.0000 | INHALATION_SPRAY | Freq: Two times a day (BID) | RESPIRATORY_TRACT | 5 refills | Status: DC

## 2018-05-08 ENCOUNTER — Other Ambulatory Visit: Payer: Self-pay

## 2018-05-08 MED ORDER — FLUTICASONE-SALMETEROL 250-50 MCG/DOSE IN AEPB: 1.0000 | INHALATION_SPRAY | Freq: Two times a day (BID) | RESPIRATORY_TRACT | 6 refills | Status: DC

## 2018-08-11 ENCOUNTER — Other Ambulatory Visit: Payer: Self-pay | Admitting: Internal Medicine

## 2018-08-11 DIAGNOSIS — J4520 Mild intermittent asthma, uncomplicated: Secondary | ICD-10-CM

## 2018-08-11 NOTE — Telephone Encounter (Signed)
Copied from [place] [phone]. Topic: Quick Communication - Rx Refill/Question >> Aug 11, 2018  3:00 PM Blase Mess A wrote: Medication: CPE scheduled 08/29/18 amLODipine (NORVASC) 10 MG tablet [[id number]] , losartan (COZAAR) 100 MG tablet [[id number]] , albuterol (PROAIR HFA) 108 (90 Base) MCG/ACT inhaler [[id number]]   Has the patient contacted their pharmacy? Yes (Agent: If no, request that the patient contact the pharmacy for the refill.) (Agent: If yes, when and what did the pharmacy advise?)  Preferred Pharmacy (with phone number or street name): [id number] [address], [place]. [phone] (Phone) [phone] (Fax)    Agent: Please be advised that RX refills may take up to 3 business days. We ask that you follow-up with your pharmacy.

## 2018-08-12 ENCOUNTER — Other Ambulatory Visit: Payer: Self-pay | Admitting: Internal Medicine

## 2018-08-12 MED ORDER — AMLODIPINE BESYLATE 10 MG PO TABS: 10.0000 mg | ORAL_TABLET | Freq: Every day | ORAL | 0 refills | Status: DC

## 2018-08-12 MED ORDER — LOSARTAN POTASSIUM 100 MG PO TABS: 100.0000 mg | ORAL_TABLET | Freq: Every day | ORAL | 0 refills | Status: DC

## 2018-08-12 MED ORDER — ALBUTEROL SULFATE HFA 108 (90 BASE) MCG/ACT IN AERS: 2.0000 | INHALATION_SPRAY | Freq: Four times a day (QID) | RESPIRATORY_TRACT | 3 refills | Status: DC | PRN

## 2018-08-29 ENCOUNTER — Encounter: Payer: BLUE CROSS/BLUE SHIELD | Admitting: Internal Medicine

## 2018-12-06 ENCOUNTER — Other Ambulatory Visit: Payer: Self-pay | Admitting: Internal Medicine

## 2018-12-16 ENCOUNTER — Other Ambulatory Visit: Payer: Self-pay | Admitting: Pulmonary Disease

## 2018-12-16 NOTE — Telephone Encounter (Signed)
Pt called requesting to have his modafinil refilled. Pt last seen by VS 08/02/17. Modafinil was last filled for pt 02/21/18 #180tabs with 3RF.  Dr. Halford Chessman, please advise if you are okay refilling pt's med and have it sent to [facility] for pt. Thanks!

## 2018-12-18 MED ORDER — MODAFINIL 200 MG PO TABS: 400.0000 mg | ORAL_TABLET | Freq: Every day | ORAL | 0 refills | Status: DC

## 2018-12-18 NOTE — Telephone Encounter (Signed)
Called and spoke with pt letting him know that med was sent to pharmacy for him. Pt verbalized understanding. Nothing further needed.

## 2018-12-18 NOTE — Telephone Encounter (Signed)
Okay to refill modafinil.  He needs to have ROV scheduled also.

## 2018-12-18 NOTE — Telephone Encounter (Signed)
Please send as med request. Dr. Halford Chessman is in the office today so you can either send it back to me or him. Thanks.

## 2018-12-18 NOTE — Telephone Encounter (Signed)
Call made to patient, made aware we can send refill but he is due for an in office visit. Appointment made for August 7th.   [place] order has been pended please refill provigil per VS.

## 2019-01-09 ENCOUNTER — Other Ambulatory Visit: Payer: Self-pay

## 2019-01-09 ENCOUNTER — Ambulatory Visit (INDEPENDENT_AMBULATORY_CARE_PROVIDER_SITE_OTHER): Payer: BC Managed Care – PPO | Admitting: Pulmonary Disease

## 2019-01-09 ENCOUNTER — Encounter: Payer: Self-pay | Admitting: Pulmonary Disease

## 2019-01-09 DIAGNOSIS — G4733 Obstructive sleep apnea (adult) (pediatric): Secondary | ICD-10-CM | POA: Diagnosis not present

## 2019-01-09 DIAGNOSIS — Z9989 Dependence on other enabling machines and devices: Secondary | ICD-10-CM

## 2019-01-09 DIAGNOSIS — J45998 Other asthma: Secondary | ICD-10-CM

## 2019-01-09 NOTE — Patient Instructions (Signed)
Follow up in 1 year.

## 2019-01-09 NOTE — Progress Notes (Signed)
[facility]  Chief Complaint  Patient presents with  . Follow-up    rarely using rescue inhaler, using advair, using cpap nightly, sleeping well at night, over 8 hours sleep a night    Constitutional:  There were no vitals taken for this visit.  Deferred  Past Medical History:  Gastric polyp, HTN, HLD, GERD, Eosinophilic esophagitis  Brief Summary:  Bryan Barber is a 48 y.o. male former smoker with asthma, and obstructive sleep apnea.  Virtual Visit via Telephone Note  I connected with Pleas Koch on 01/09/19 at 12:00 PM EDT by telephone and verified that I am speaking with the correct person using two identifiers.  Location: Patient: home Provider: medical office   I discussed the limitations, risks, security and privacy concerns of performing an evaluation and management service by telephone and the availability of in person appointments. I also discussed with the patient that there may be a patient responsible charge related to this service. The patient expressed understanding and agreed to proceed.   breathing has been good.  Only needing to use advair one puff daily.  Occasional has dry cough and uses albuterol.  Not having fever, wheeze, chest pain, dyspnea, skin rash, or leg swelling.  Likes his new CPAP machine.  No issues with mask fit.  Uses nasal mask.  Denies sinus congestion, sore throat, dry mouth.  Physical Exam:  Deferred   Assessment/Plan:   Obstructive sleep apnea. - he is compliant with therapy and reports benefit - continue auto CPAP   Persistent daytime hypersomnia with history of obstructive sleep apnea. - continue provigil 200 mg daily  Mild, persistent asthma. - advair one puff daily - prn albuterol   Patient Instructions  Follow up in 1 year   I discussed the assessment and treatment plan with the patient. The patient was provided an opportunity to ask questions and all were answered. The  patient agreed with the plan and demonstrated an understanding of the instructions.   The patient was advised to call back or seek an in-person evaluation if the symptoms worsen or if the condition fails to improve as anticipated.  I provided 14 minutes of non-face-to-face time during this encounter.  Chesley Mires, MD [facility] Pager: [phone] 01/09/2019, 12:18 PM  Flow Sheet     Pulmonary tests:  PFT 08/09/17 >> FEV1 3.26 (84%), FEV1% 82, TC 5.77 (88%), DLCO 119%, +BD  Sleep tests:  PSG 04/13/05 >> AHI 60  Cardiac tests:  Echo 09/09/17 >> EF 60 to 65%, grade 1 DD  Medications:   Allergies as of 01/09/2019      Reactions   Aspirin    Face swelling       Medication List       Accurate as of January 09, 2019 12:18 PM. If you have any questions, ask your nurse or doctor.        albuterol 108 (90 Base) MCG/ACT inhaler Commonly known as: ProAir HFA Inhale 2 puffs into the lungs every 6 (six) hours as needed.   amLODipine 10 MG tablet Commonly known as: NORVASC TAKE 1 TABLET BY MOUTH EVERY DAY   Fluticasone-Salmeterol 250-50 MCG/DOSE Aepb Commonly known as: Advair Diskus Inhale 1 puff into the lungs 2 (two) times daily.   Lomaira 8 MG Tabs Generic drug: Phentermine HCl TAKE 1 TABLET BY MOUTH IN THE MORNING AND AGAIN AROUND 1-2PM   losartan 100 MG tablet Commonly known as: COZAAR TAKE 1 TABLET BY MOUTH  EVERY DAY   modafinil 200 MG tablet Commonly known as: PROVIGIL Take 2 tablets (400 mg total) by mouth daily.       Past Surgical History:  He  has a past surgical history that includes Wrist surgery (10/2010); Wisdom tooth extraction; and Esophagogastroduodenoscopy (egd) with esophageal dilation (08/20/2011).  Family History:  His family history includes Atrial fibrillation in his father; Cancer in his maternal grandmother; Heart disease in his paternal grandfather; Hypertension in his father; Pancreatic cancer in his maternal grandfather;  Sleep apnea in his mother.  Social History:  He  reports that he quit smoking about 23 years ago. His smoking use included cigarettes. He has a 10.00 pack-year smoking history. He has never used smokeless tobacco. He reports that he does not drink alcohol or use drugs.

## 2019-02-02 ENCOUNTER — Other Ambulatory Visit: Payer: Self-pay | Admitting: Nurse Practitioner

## 2019-02-03 NOTE — Telephone Encounter (Signed)
Appropriate for refill

## 2019-02-05 NOTE — Telephone Encounter (Signed)
Patient last seen 8.7.2020 by VS  Obstructive sleep apnea. - he is compliant with therapy and reports benefit - continue auto CPAP    Persistent daytime hypersomnia with history of obstructive sleep apnea. - continue provigil 200 mg daily   Mild, persistent asthma. - advair one puff daily - prn albuterol     Patient Instructions  Follow up in 1 year  Provigil 200mg  2tabs daily last refilled #60  7.16.2020 by Lazaro Arms NP  Tammy please advise if okay to refill, thank you.

## 2019-02-06 ENCOUNTER — Telehealth: Payer: Self-pay | Admitting: Pulmonary Disease

## 2019-02-06 MED ORDER — MODAFINIL 200 MG PO TABS: 400.0000 mg | ORAL_TABLET | Freq: Every day | ORAL | 5 refills | Status: DC

## 2019-02-06 NOTE — Telephone Encounter (Signed)
Spoke with pt. He is needing a refill on Modafinil. Rx has been called in as VS's last OV note stated for him to continue this medication. Nothing further was needed.

## 2019-02-19 IMAGING — DX DG CHEST 2V
2 series · 2 of 2 positions shown · non-contrast
Comparison: Radiographs May 16, 2016.

CLINICAL DATA: Shortness of breath.

EXAM:
CHEST  2 VIEW

[chest pa]
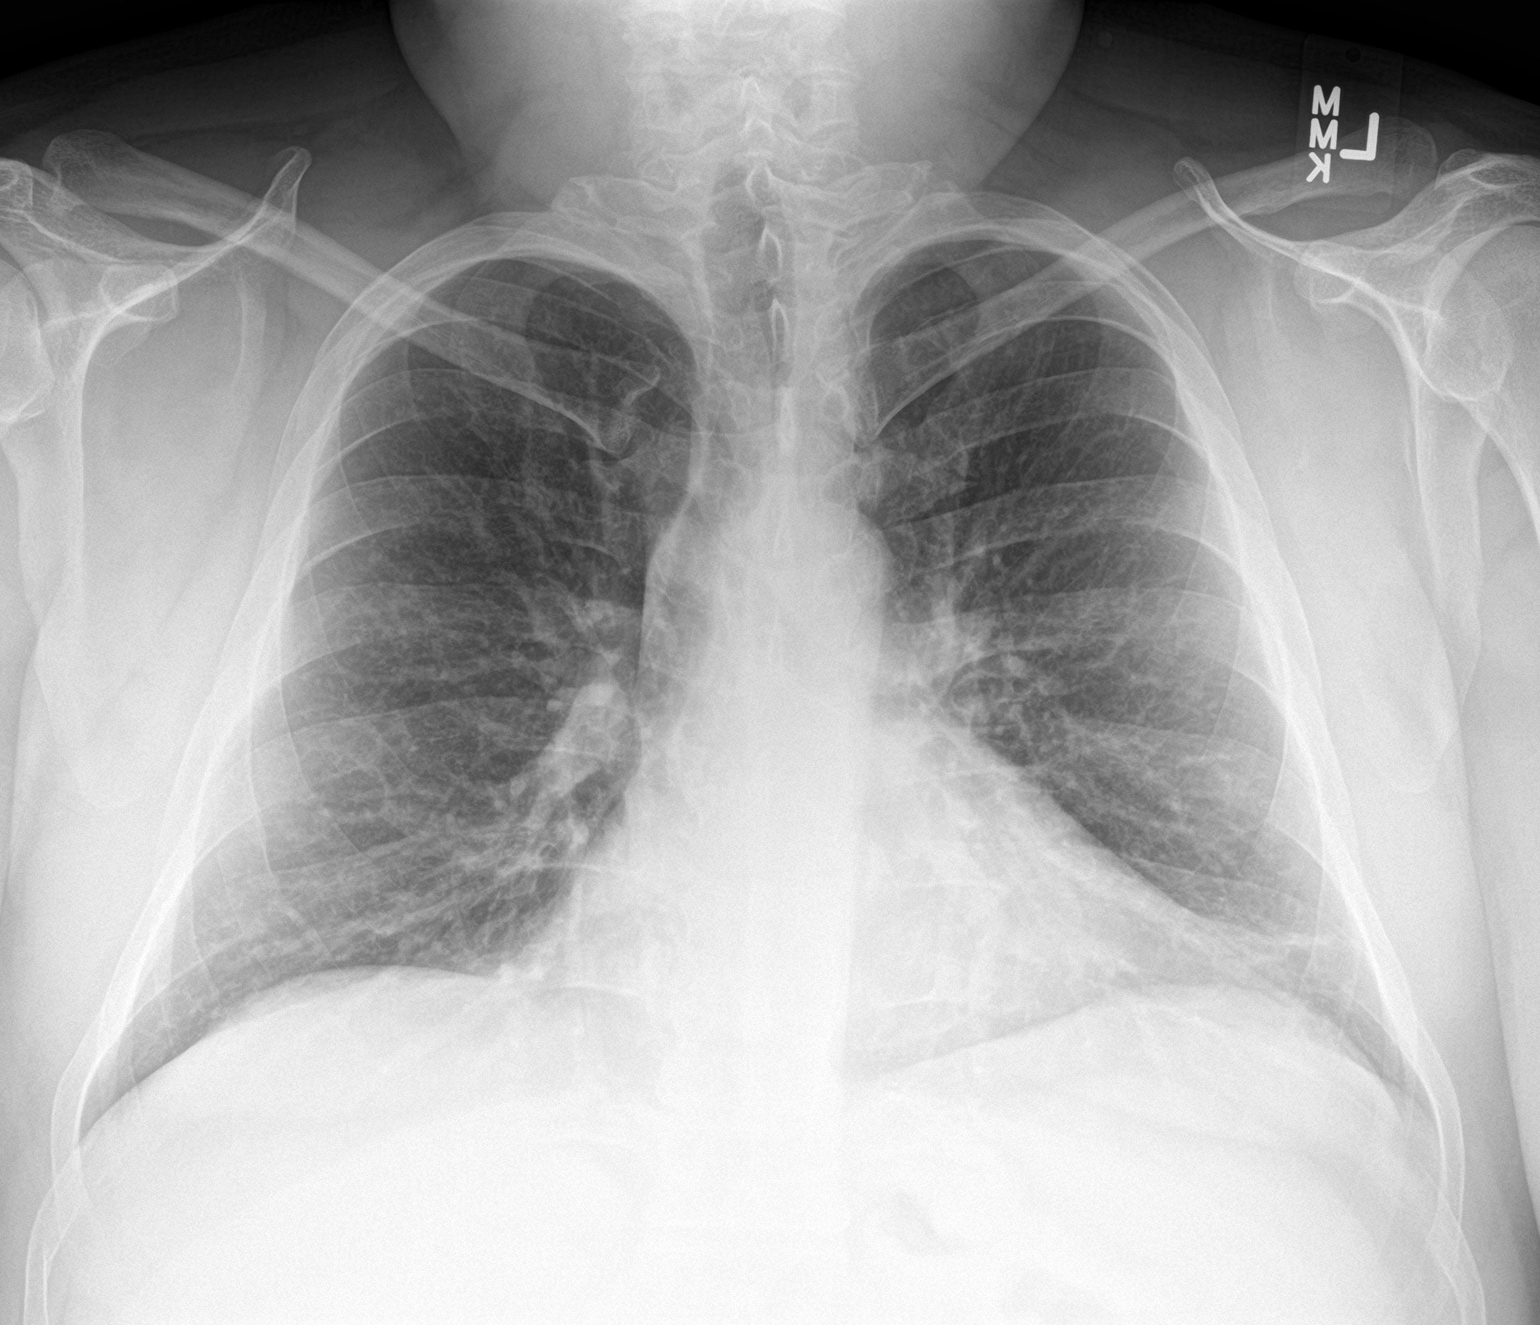

[chest lat]
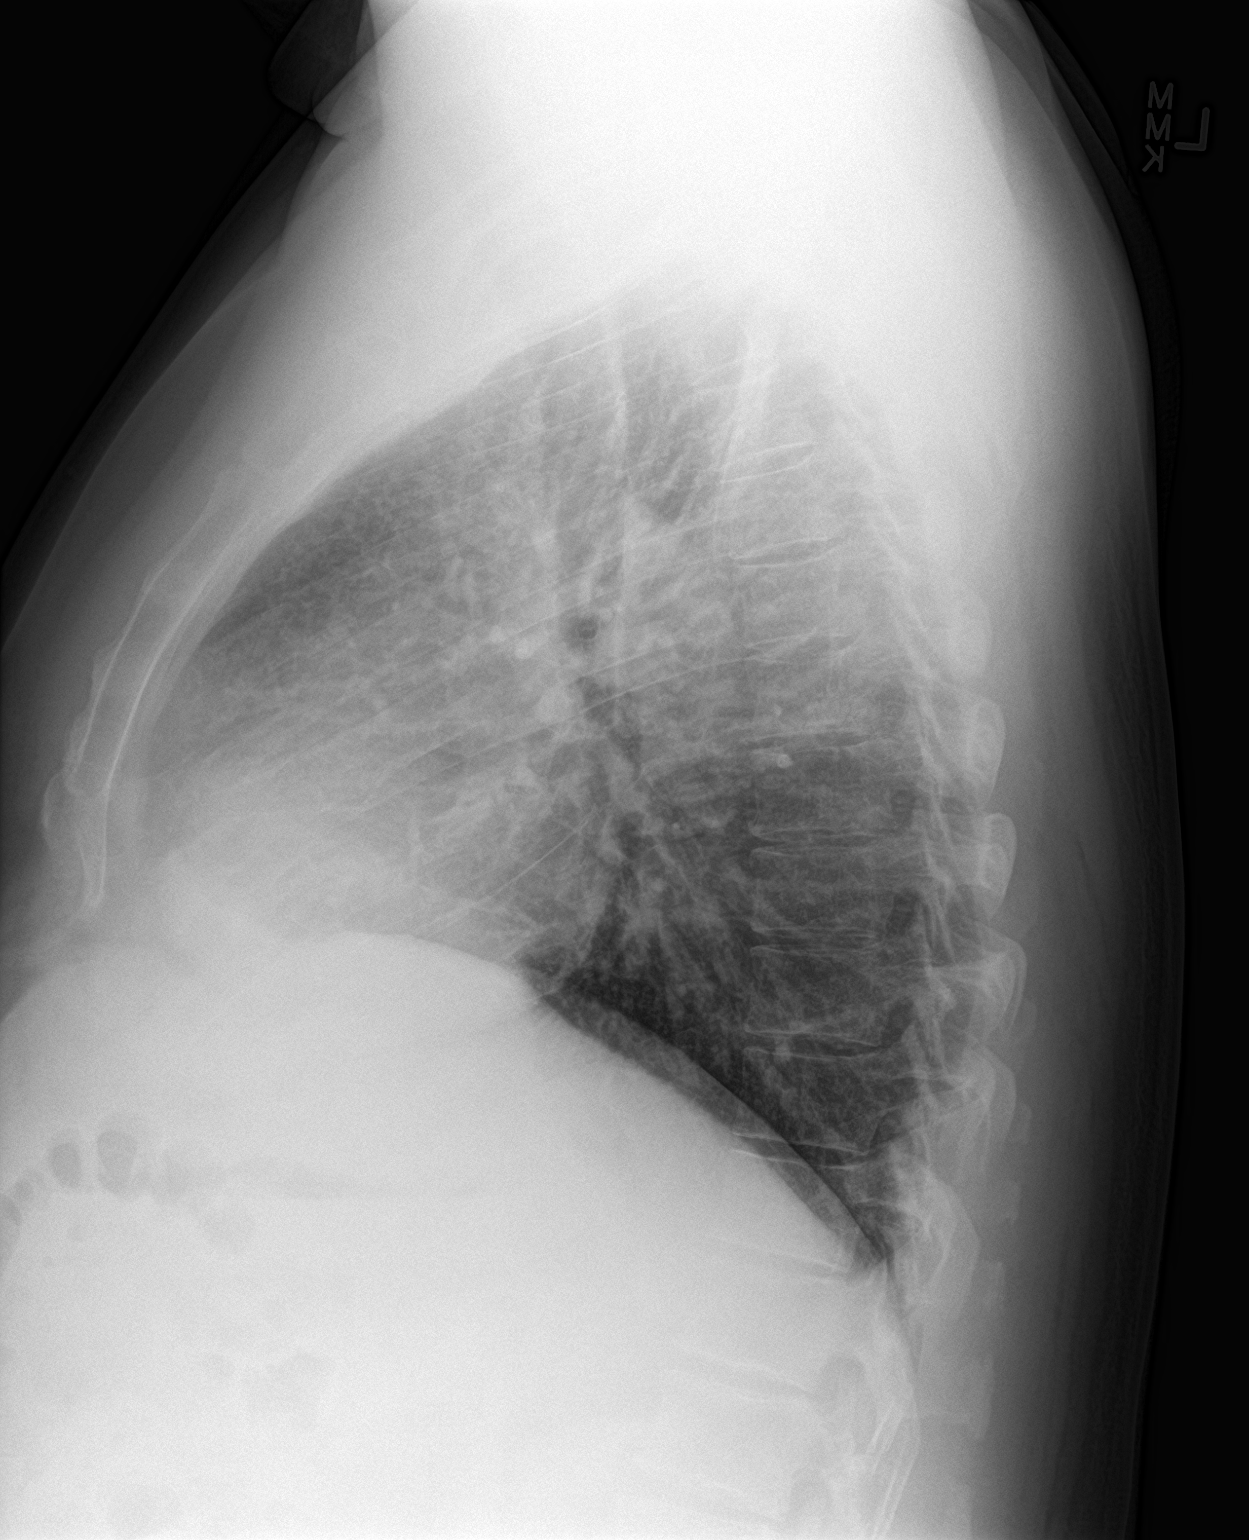

[2 of 2 positions shown; findings below may reference images not displayed]

FINDINGS: The heart size and mediastinal contours are within normal limits. No
pneumothorax or pleural effusion is noted. Stable left basilar
scarring is noted. No acute pulmonary disease is noted. The
visualized skeletal structures are unremarkable.
IMPRESSION: No active cardiopulmonary disease.

## 2019-03-02 ENCOUNTER — Other Ambulatory Visit: Payer: Self-pay | Admitting: Internal Medicine

## 2019-03-06 ENCOUNTER — Other Ambulatory Visit: Payer: Self-pay | Admitting: Internal Medicine

## 2019-03-13 ENCOUNTER — Telehealth: Payer: Self-pay | Admitting: Pulmonary Disease

## 2019-03-13 NOTE — Telephone Encounter (Signed)
Medication name and strength: Modafinil 200mg  Provider: Dr. Halford Chessman Pharmacy: [facility] Patient insurance ID: [id number] Phone: [phone]  Was the PA started on CMM?  yes If yes, please enter the Key: [id number]   Bryan Barber Key: [id number] - PA Case ID: [id number] - Rx #: [id number] Need help? Call us at [phone]  Outcome  Approved today  Your PA request has been approved. Additional information will be provided in the approval communication. (Message 1145)  DrugModafinil 200MG  tablets  [employer] Electronic PA Form (NCPDP)  Original Claim [place] REQ-MD CALL [phone].DRUG REQUIRES PRIOR AUTHORIZATION  Patient aware PA has been approved. [facility] [address] made aware of PA approval. Patient requested future refills to be 90 days supply if possible. Nothing further at this time.

## 2019-03-19 ENCOUNTER — Ambulatory Visit (INDEPENDENT_AMBULATORY_CARE_PROVIDER_SITE_OTHER): Payer: BC Managed Care – PPO | Admitting: Internal Medicine

## 2019-03-19 ENCOUNTER — Other Ambulatory Visit: Payer: Self-pay | Admitting: Internal Medicine

## 2019-03-19 ENCOUNTER — Other Ambulatory Visit: Payer: Self-pay

## 2019-03-19 ENCOUNTER — Encounter: Payer: Self-pay | Admitting: Internal Medicine

## 2019-03-19 ENCOUNTER — Other Ambulatory Visit (INDEPENDENT_AMBULATORY_CARE_PROVIDER_SITE_OTHER): Payer: BC Managed Care – PPO

## 2019-03-19 VITALS — BP 124/86 | HR 122 | Temp 98.3°F | Ht 69.0 in | Wt 276.0 lb

## 2019-03-19 DIAGNOSIS — G4700 Insomnia, unspecified: Secondary | ICD-10-CM

## 2019-03-19 DIAGNOSIS — Z0001 Encounter for general adult medical examination with abnormal findings: Secondary | ICD-10-CM

## 2019-03-19 DIAGNOSIS — R739 Hyperglycemia, unspecified: Secondary | ICD-10-CM

## 2019-03-19 DIAGNOSIS — E785 Hyperlipidemia, unspecified: Secondary | ICD-10-CM | POA: Diagnosis not present

## 2019-03-19 DIAGNOSIS — E559 Vitamin D deficiency, unspecified: Secondary | ICD-10-CM

## 2019-03-19 DIAGNOSIS — Z23 Encounter for immunization: Secondary | ICD-10-CM | POA: Diagnosis not present

## 2019-03-19 DIAGNOSIS — E538 Deficiency of other specified B group vitamins: Secondary | ICD-10-CM

## 2019-03-19 DIAGNOSIS — E611 Iron deficiency: Secondary | ICD-10-CM

## 2019-03-19 DIAGNOSIS — I10 Essential (primary) hypertension: Secondary | ICD-10-CM

## 2019-03-19 LAB — URINALYSIS, ROUTINE W REFLEX MICROSCOPIC
Bilirubin Urine: NEGATIVE
Hgb urine dipstick: NEGATIVE
Ketones, ur: NEGATIVE
Leukocytes,Ua: NEGATIVE
Nitrite: NEGATIVE
RBC / HPF: NONE SEEN (ref 0–?)
Specific Gravity, Urine: 1.02 (ref 1.000–1.030)
Total Protein, Urine: NEGATIVE
Urine Glucose: NEGATIVE
Urobilinogen, UA: 0.2 (ref 0.0–1.0)
pH: 6 (ref 5.0–8.0)

## 2019-03-19 LAB — CBC WITH DIFFERENTIAL/PLATELET
Basophils Absolute: 0.1 10*3/uL (ref 0.0–0.1)
Basophils Relative: 0.9 % (ref 0.0–3.0)
Eosinophils Absolute: 0.3 10*3/uL (ref 0.0–0.7)
Eosinophils Relative: 4.3 % (ref 0.0–5.0)
HCT: 44.9 % (ref 39.0–52.0)
Hemoglobin: 15.3 g/dL (ref 13.0–17.0)
Lymphocytes Relative: 22.8 % (ref 12.0–46.0)
Lymphs Abs: 1.7 10*3/uL (ref 0.7–4.0)
MCHC: 34.1 g/dL (ref 30.0–36.0)
MCV: 92.4 fl (ref 78.0–100.0)
Monocytes Absolute: 0.8 10*3/uL (ref 0.1–1.0)
Monocytes Relative: 10.3 % (ref 3.0–12.0)
Neutro Abs: 4.5 10*3/uL (ref 1.4–7.7)
Neutrophils Relative %: 61.7 % (ref 43.0–77.0)
Platelets: 269 10*3/uL (ref 150.0–400.0)
RBC: 4.86 Mil/uL (ref 4.22–5.81)
RDW: 13.7 % (ref 11.5–15.5)
WBC: 7.4 10*3/uL (ref 4.0–10.5)

## 2019-03-19 LAB — LIPID PANEL
Cholesterol: 255 mg/dL — ABNORMAL HIGH (ref 0–200)
HDL: 43.4 mg/dL (ref >39.00)
NonHDL: 211.9
Total CHOL/HDL Ratio: 6
Triglycerides: 216 mg/dL — ABNORMAL HIGH (ref 0.0–149.0)
VLDL: 43.2 mg/dL — ABNORMAL HIGH (ref 0.0–40.0)

## 2019-03-19 LAB — HEPATIC FUNCTION PANEL
ALT: 27 U/L (ref 0–53)
AST: 17 U/L (ref 0–37)
Albumin: 4.4 g/dL (ref 3.5–5.2)
Alkaline Phosphatase: 73 U/L (ref 39–117)
Bilirubin, Direct: 0.1 mg/dL (ref 0.0–0.3)
Total Bilirubin: 0.4 mg/dL (ref 0.2–1.2)
Total Protein: 7.3 g/dL (ref 6.0–8.3)

## 2019-03-19 LAB — BASIC METABOLIC PANEL
BUN: 11 mg/dL (ref 6–23)
CO2: 25 mEq/L (ref 19–32)
Calcium: 9.3 mg/dL (ref 8.4–10.5)
Chloride: 102 mEq/L (ref 96–112)
Creatinine, Ser: 1.05 mg/dL (ref 0.40–1.50)
GFR: 76.06 mL/min (ref >60.00)
Glucose, Bld: 153 mg/dL — ABNORMAL HIGH (ref 70–99)
Potassium: 3.6 mEq/L (ref 3.5–5.1)
Sodium: 134 mEq/L — ABNORMAL LOW (ref 135–145)

## 2019-03-19 LAB — IBC PANEL
Iron: 64 ug/dL (ref 42–165)
Saturation Ratios: 18.2 % — ABNORMAL LOW (ref 20.0–50.0)
Transferrin: 251 mg/dL (ref 212.0–360.0)

## 2019-03-19 LAB — LDL CHOLESTEROL, DIRECT: Direct LDL: 183 mg/dL

## 2019-03-19 LAB — VITAMIN B12: Vitamin B-12: 158 pg/mL — ABNORMAL LOW (ref 211–911)

## 2019-03-19 LAB — PSA: PSA: 0.53 ng/mL (ref 0.10–4.00)

## 2019-03-19 LAB — HEMOGLOBIN A1C: Hgb A1c MFr Bld: 6 % (ref 4.6–6.5)

## 2019-03-19 LAB — VITAMIN D 25 HYDROXY (VIT D DEFICIENCY, FRACTURES): VITD: 13 ng/mL — ABNORMAL LOW (ref 30.00–100.00)

## 2019-03-19 LAB — TSH: TSH: 1.94 u[IU]/mL (ref 0.35–4.50)

## 2019-03-19 MED ORDER — LOSARTAN POTASSIUM 100 MG PO TABS: 100.0000 mg | ORAL_TABLET | Freq: Every day | ORAL | 3 refills | Status: DC

## 2019-03-19 MED ORDER — AMLODIPINE BESYLATE 10 MG PO TABS: 10.0000 mg | ORAL_TABLET | Freq: Every day | ORAL | 3 refills | Status: DC

## 2019-03-19 MED ORDER — VITAMIN D (ERGOCALCIFEROL) 1.25 MG (50000 UNIT) PO CAPS: 50000.0000 [IU] | ORAL_CAPSULE | ORAL | 0 refills | Status: DC

## 2019-03-19 MED ORDER — ESZOPICLONE 2 MG PO TABS: 2.0000 mg | ORAL_TABLET | Freq: Every evening | ORAL | 1 refills | Status: DC | PRN

## 2019-03-19 MED ORDER — ROSUVASTATIN CALCIUM 20 MG PO TABS: 20.0000 mg | ORAL_TABLET | Freq: Every day | ORAL | 3 refills | Status: DC

## 2019-03-19 NOTE — Patient Instructions (Addendum)
You had the flu shot today  Please take all new medication as prescribed - the lunesta for sleep  Please continue all other medications as before, and refills have been done if requested.  Please have the pharmacy call with any other refills you may need.  Please continue your efforts at being more active, low cholesterol diet, and weight control.  You are otherwise up to date with prevention measures today.  Please keep your appointments with your specialists as you may have planned  Please go to the LAB in the Basement (turn left off the elevator) for the tests to be done today  You will be contacted by phone if any changes need to be made immediately.  Otherwise, you will receive a letter about your results with an explanation, but please check with MyChart first.  Please remember to sign up for MyChart if you have not done so, as this will be important to you in the future with finding out test results, communicating by private email, and scheduling acute appointments online when needed.  Please return in 1 year for your yearly visit, or sooner if needed, with Lab testing done 3-5 days before

## 2019-03-19 NOTE — Assessment & Plan Note (Signed)
stable overall by history and exam, recent data reviewed with pt, and pt to continue medical treatment as before,  to f/u any worsening symptoms or concerns  

## 2019-03-19 NOTE — Assessment & Plan Note (Signed)
For lower chol diet, for lipids with labs, goal ldl < 100

## 2019-03-19 NOTE — Assessment & Plan Note (Addendum)
Recent worsening, for lunesta 2 mg qhs prn,  to f/u any worsening symptoms or concerns  In addition to the time spent performing CPE, I spent an additional 15 minutes face to face,in which greater than 50% of this time was spent in counseling and coordination of care for patient's illness as documented, including the differential dx, treatment, further evaluation and other management of insomnia, hyperglycemia, HLD, HTN

## 2019-03-19 NOTE — Assessment & Plan Note (Signed)

## 2019-03-19 NOTE — Progress Notes (Signed)
Subjective:    Patient ID: Bryan Barber, male    DOB: 18-Jul-1970, 48 y.o.   MRN: [id number]  HPI  Here for wellness and f/u;  Overall doing ok;  Pt denies Chest pain, worsening SOB, DOE, wheezing, orthopnea, PND, worsening LE edema, palpitations, dizziness or syncope.  Pt denies neurological change such as new headache, facial or extremity weakness.  Pt denies polydipsia, polyuria, or low sugar symptoms. Pt states overall good compliance with treatment and medications, good tolerability, and has been trying to follow appropriate diet.  Pt denies worsening depressive symptoms, suicidal ideation or panic. No fever, night sweats, wt loss, loss of appetite, or other constitutional symptoms.  Pt states good ability with ADL's, has low fall risk, home safety reviewed and adequate, no other significant changes in hearing or vision, and only occasionally active with exercise.  Wt Readings from Last 3 Encounters:  03/19/19 276 lb (125.2 kg)  02/28/18 243 lb 12.8 oz (110.6 kg)  12/04/17 263 lb (119.3 kg)   BP Readings from Last 3 Encounters:  03/19/19 124/86  02/28/18 130/80  12/04/17 124/84  Also c/o osnet several months difficulty with getting to sleep most nights, mild to mod, intermittent, just cant seem to turn off the thoughts, nothing seems to make better or worse. Past Medical History:  Diagnosis Date  . Allergic rhinitis   . Asthma   . EE (eosinophilic esophagitis) 99991111  . GERD (gastroesophageal reflux disease) 08/20/2011  . HLD (hyperlipidemia)   . Hypertension   . Multiple gastric polyps 08/20/2011  . Obesity   . OSA on CPAP    Past Surgical History:  Procedure Laterality Date  . ESOPHAGOGASTRODUODENOSCOPY (EGD) WITH ESOPHAGEAL DILATION  08/20/2011  . WISDOM TOOTH EXTRACTION    . WRIST SURGERY  10/2010   cyst removed. Right wrist    reports that he quit smoking about 23 years ago. His smoking use included cigarettes. He has a 10.00 pack-year smoking history. He has never used  smokeless tobacco. He reports that he does not drink alcohol or use drugs. family history includes Atrial fibrillation in his father; Cancer in his maternal grandmother; Heart disease in his paternal grandfather; Hypertension in his father; Pancreatic cancer in his maternal grandfather; Sleep apnea in his mother. Allergies  Allergen Reactions  . Aspirin     Face swelling    Current Outpatient Medications on File Prior to Visit  Medication Sig Dispense Refill  . albuterol (PROAIR HFA) 108 (90 Base) MCG/ACT inhaler Inhale 2 puffs into the lungs every 6 (six) hours as needed. 3 Inhaler 3  . Fluticasone-Salmeterol (ADVAIR DISKUS) 250-50 MCG/DOSE AEPB Inhale 1 puff into the lungs 2 (two) times daily. 1 each 6  . modafinil (PROVIGIL) 200 MG tablet Take 2 tablets (400 mg total) by mouth daily. 60 tablet 5   No current facility-administered medications on file prior to visit.    Review of Systems Constitutional: Negative for other unusual diaphoresis, sweats, appetite or weight changes HENT: Negative for other worsening hearing loss, ear pain, facial swelling, mouth sores or neck stiffness.   Eyes: Negative for other worsening pain, redness or other visual disturbance.  Respiratory: Negative for other stridor or swelling Cardiovascular: Negative for other palpitations or other chest pain  Gastrointestinal: Negative for worsening diarrhea or loose stools, blood in stool, distention or other pain Genitourinary: Negative for hematuria, flank pain or other change in urine volume.  Musculoskeletal: Negative for myalgias or other joint swelling.  Skin: Negative for other color change,  or other wound or worsening drainage.  Neurological: Negative for other syncope or numbness. Hematological: Negative for other adenopathy or swelling Psychiatric/Behavioral: Negative for hallucinations, other worsening agitation, SI, self-injury, or new decreased concentration All otherwise neg per pt    Objective:    Physical Exam BP 124/86   Pulse (!) 122   Temp 98.3 F (36.8 C) (Oral)   Ht 5\' 9"  (1.753 m)   Wt 276 lb (125.2 kg)   SpO2 99%   BMI 40.76 kg/m  VS noted,  Constitutional: Pt is oriented to person, place, and time. Appears well-developed and well-nourished, in no significant distress and comfortable Head: Normocephalic and atraumatic  Eyes: Conjunctivae and EOM are normal. Pupils are equal, round, and reactive to light Right Ear: External ear normal without discharge Left Ear: External ear normal without discharge Nose: Nose without discharge or deformity Mouth/Throat: Oropharynx is without other ulcerations and moist  Neck: Normal range of motion. Neck supple. No JVD present. No tracheal deviation present or significant neck LA or mass Cardiovascular: Normal rate, regular rhythm, normal heart sounds and intact distal pulses.   Pulmonary/Chest: WOB normal and breath sounds without rales or wheezing  Abdominal: Soft. Bowel sounds are normal. NT. No HSM  Musculoskeletal: Normal range of motion. Exhibits no edema Lymphadenopathy: Has no other cervical adenopathy.  Neurological: Pt is alert and oriented to person, place, and time. Pt has normal reflexes. No cranial nerve deficit. Motor grossly intact, Gait intact Skin: Skin is warm and dry. No rash noted or new ulcerations Psychiatric:  Has normal mood and affect. Behavior is normal without agitation All otherwise neg per pt  Lab Results  Component Value Date   WBC 7.4 03/19/2019   HGB 15.3 03/19/2019   HCT 44.9 03/19/2019   PLT 269.0 03/19/2019   GLUCOSE 153 (H) 03/19/2019   CHOL 255 (H) 03/19/2019   TRIG 216.0 (H) 03/19/2019   HDL 43.40 03/19/2019   LDLDIRECT 183.0 03/19/2019   LDLCALC 166 (H) 08/30/2017   ALT 27 03/19/2019   AST 17 03/19/2019   NA 134 (L) 03/19/2019   K 3.6 03/19/2019   CL 102 03/19/2019   CREATININE 1.05 03/19/2019   BUN 11 03/19/2019   CO2 25 03/19/2019   TSH 1.94 03/19/2019   PSA 0.53 03/19/2019    HGBA1C 6.0 03/19/2019      Assessment & Plan:

## 2019-04-01 ENCOUNTER — Other Ambulatory Visit: Payer: Self-pay

## 2019-04-01 ENCOUNTER — Ambulatory Visit (INDEPENDENT_AMBULATORY_CARE_PROVIDER_SITE_OTHER): Payer: BC Managed Care – PPO

## 2019-04-01 DIAGNOSIS — E538 Deficiency of other specified B group vitamins: Secondary | ICD-10-CM

## 2019-04-01 MED ORDER — CYANOCOBALAMIN 1000 MCG/ML IJ SOLN
1000.0000 ug | Freq: Once | INTRAMUSCULAR | Status: AC
  Administered 2019-04-01: 1000 ug via INTRAMUSCULAR

## 2019-04-01 NOTE — Progress Notes (Signed)
Medical screening examination/treatment/procedure(s) were performed by non-physician practitioner and as supervising physician I was immediately available for consultation/collaboration. I agree with above. James John, MD   

## 2019-04-29 ENCOUNTER — Other Ambulatory Visit: Payer: Self-pay

## 2019-04-29 ENCOUNTER — Ambulatory Visit (INDEPENDENT_AMBULATORY_CARE_PROVIDER_SITE_OTHER): Payer: BC Managed Care – PPO

## 2019-04-29 DIAGNOSIS — E538 Deficiency of other specified B group vitamins: Secondary | ICD-10-CM

## 2019-04-29 MED ORDER — CYANOCOBALAMIN 1000 MCG/ML IJ SOLN
1000.0000 ug | Freq: Once | INTRAMUSCULAR | Status: AC
  Administered 2019-04-29: 1000 ug via INTRAMUSCULAR

## 2019-04-29 NOTE — Progress Notes (Signed)
Medical screening examination/treatment/procedure(s) were performed by non-physician practitioner and as supervising physician I was immediately available for consultation/collaboration. I agree with above. James John, MD   

## 2019-05-27 ENCOUNTER — Encounter: Payer: Self-pay | Admitting: Internal Medicine

## 2019-05-27 ENCOUNTER — Ambulatory Visit (INDEPENDENT_AMBULATORY_CARE_PROVIDER_SITE_OTHER): Payer: BC Managed Care – PPO | Admitting: Internal Medicine

## 2019-05-27 ENCOUNTER — Other Ambulatory Visit: Payer: Self-pay

## 2019-05-27 ENCOUNTER — Ambulatory Visit: Payer: BC Managed Care – PPO

## 2019-05-27 VITALS — BP 124/76 | HR 99 | Temp 98.2°F | Resp 12 | Ht 67.0 in | Wt 281.6 lb

## 2019-05-27 DIAGNOSIS — K429 Umbilical hernia without obstruction or gangrene: Secondary | ICD-10-CM

## 2019-05-27 DIAGNOSIS — E559 Vitamin D deficiency, unspecified: Secondary | ICD-10-CM

## 2019-05-27 DIAGNOSIS — I10 Essential (primary) hypertension: Secondary | ICD-10-CM

## 2019-05-27 DIAGNOSIS — F988 Other specified behavioral and emotional disorders with onset usually occurring in childhood and adolescence: Secondary | ICD-10-CM | POA: Diagnosis not present

## 2019-05-27 DIAGNOSIS — E538 Deficiency of other specified B group vitamins: Secondary | ICD-10-CM

## 2019-05-27 DIAGNOSIS — E785 Hyperlipidemia, unspecified: Secondary | ICD-10-CM

## 2019-05-27 DIAGNOSIS — G4700 Insomnia, unspecified: Secondary | ICD-10-CM | POA: Diagnosis not present

## 2019-05-27 DIAGNOSIS — R739 Hyperglycemia, unspecified: Secondary | ICD-10-CM

## 2019-05-27 MED ORDER — TRAZODONE HCL 50 MG PO TABS: 25.0000 mg | ORAL_TABLET | Freq: Every evening | ORAL | 1 refills | Status: DC | PRN

## 2019-05-27 MED ORDER — AMPHETAMINE-DEXTROAMPHETAMINE 20 MG PO TABS: 20.0000 mg | ORAL_TABLET | Freq: Two times a day (BID) | ORAL | 0 refills | Status: DC

## 2019-05-27 MED ORDER — CYANOCOBALAMIN 1000 MCG/ML IJ SOLN
1000.0000 ug | Freq: Once | INTRAMUSCULAR | Status: AC
  Administered 2019-05-27: 1000 ug via INTRAMUSCULAR

## 2019-05-27 NOTE — Progress Notes (Signed)
Subjective:    Patient ID: Bryan Barber, male    DOB: 1971/05/14, 48 y.o.   MRN: [id number]  HPI  Here to f/u; overall doing ok,  Pt denies chest pain, increasing sob or doe, wheezing, orthopnea, PND, increased LE swelling, palpitations, dizziness or syncope.  Pt denies new neurological symptoms such as new headache, or facial or extremity weakness or numbness.  Pt denies polydipsia, polyuria, or low sugar episode.  Pt states overall good compliance with meds, mostly trying to follow appropriate diet, with wt overall stable,  but little exercise however. Had Wt gain with covid.   Wt Readings from Last 3 Encounters:  05/27/19 281 lb 9.6 oz (127.7 kg)  03/19/19 276 lb (125.2 kg)  02/28/18 243 lb 12.8 oz (110.6 kg)  Due for b12 shot, has taken the high dose vit d.  Also has difficulty with concentration all his life it seems, hard to get through school, difficult to get things done to completion, son recently dx with ADD and tx with adderall, and realizes he has this issue as well.  Also has worsening sleep recently for unclear reasons, Denies worsening depressive symptoms, suicidal ideation, or panic; has ongoing anxiety, but not increased lately.  Also has an umbilical hernia with mild pain only with trying to push it in.   Past Medical History:  Diagnosis Date  . Allergic rhinitis   . Asthma   . EE (eosinophilic esophagitis) 99991111  . GERD (gastroesophageal reflux disease) 08/20/2011  . HLD (hyperlipidemia)   . Hypertension   . Multiple gastric polyps 08/20/2011  . Obesity   . OSA on CPAP    Past Surgical History:  Procedure Laterality Date  . ESOPHAGOGASTRODUODENOSCOPY (EGD) WITH ESOPHAGEAL DILATION  08/20/2011  . WISDOM TOOTH EXTRACTION    . WRIST SURGERY  10/2010   cyst removed. Right wrist    reports that he quit smoking about 24 years ago. His smoking use included cigarettes. He has a 10.00 pack-year smoking history. He has never used smokeless tobacco. He reports that he does  not drink alcohol or use drugs. family history includes Atrial fibrillation in his father; Cancer in his maternal grandmother; Heart disease in his paternal grandfather; Hypertension in his father; Pancreatic cancer in his maternal grandfather; Sleep apnea in his mother. Allergies  Allergen Reactions  . Aspirin     Face swelling    Current Outpatient Medications on File Prior to Visit  Medication Sig Dispense Refill  . albuterol (PROAIR HFA) 108 (90 Base) MCG/ACT inhaler Inhale 2 puffs into the lungs every 6 (six) hours as needed. 3 Inhaler 3  . amLODipine (NORVASC) 10 MG tablet Take 1 tablet (10 mg total) by mouth daily. 90 tablet 3  . Fluticasone-Salmeterol (ADVAIR DISKUS) 250-50 MCG/DOSE AEPB Inhale 1 puff into the lungs 2 (two) times daily. 1 each 6  . losartan (COZAAR) 100 MG tablet Take 1 tablet (100 mg total) by mouth daily. 90 tablet 3  . modafinil (PROVIGIL) 200 MG tablet Take 2 tablets (400 mg total) by mouth daily. 60 tablet 5  . rosuvastatin (CRESTOR) 20 MG tablet Take 1 tablet (20 mg total) by mouth daily. 90 tablet 3  . Vitamin D, Ergocalciferol, (DRISDOL) 1.25 MG (50000 UT) CAPS capsule Take 1 capsule (50,000 Units total) by mouth every 7 (seven) days. (Patient not taking: Reported on 05/27/2019) 12 capsule 0   No current facility-administered medications on file prior to visit.   Review of Systems  Constitutional: Negative for other unusual  diaphoresis or sweats HENT: Negative for ear discharge or swelling Eyes: Negative for other worsening visual disturbances Respiratory: Negative for stridor or other swelling  Gastrointestinal: Negative for worsening distension or other blood Genitourinary: Negative for retention or other urinary change Musculoskeletal: Negative for other MSK pain or swelling Skin: Negative for color change or other new lesions Neurological: Negative for worsening tremors and other numbness  Psychiatric/Behavioral: Negative for worsening agitation or  other fatigue All otherwise neg per pt     Objective:   Physical Exam BP 124/76   Pulse 99   Temp 98.2 F (36.8 C)   Resp 12   Ht 5\' 7"  (1.702 m)   Wt 281 lb 9.6 oz (127.7 kg)   SpO2 99%   BMI 44.10 kg/m  VS noted,  Constitutional: Pt appears in NAD HENT: Head: NCAT.  Right Ear: External ear normal.  Left Ear: External ear normal.  Eyes: . Pupils are equal, round, and reactive to light. Conjunctivae and EOM are normal Nose: without d/c or deformity Neck: Neck supple. Gross normal ROM Cardiovascular: Normal rate and regular rhythm.   Pulmonary/Chest: Effort normal and breath sounds without rales or wheezing.  Abd:  Soft, NT, ND, + BS, no organomegaly Neurological: Pt is alert. At baseline orientation, motor grossly intact Skin: Skin is warm. No rashes, other new lesions, no LE edema Psychiatric: Pt behavior is normal without agitation  All otherwise neg per pt  Lab Results  Component Value Date   WBC 7.4 03/19/2019   HGB 15.3 03/19/2019   HCT 44.9 03/19/2019   PLT 269.0 03/19/2019   GLUCOSE 153 (H) 03/19/2019   CHOL 255 (H) 03/19/2019   TRIG 216.0 (H) 03/19/2019   HDL 43.40 03/19/2019   LDLDIRECT 183.0 03/19/2019   LDLCALC 166 (H) 08/30/2017   ALT 27 03/19/2019   AST 17 03/19/2019   NA 134 (L) 03/19/2019   K 3.6 03/19/2019   CL 102 03/19/2019   CREATININE 1.05 03/19/2019   BUN 11 03/19/2019   CO2 25 03/19/2019   TSH 1.94 03/19/2019   PSA 0.53 03/19/2019   HGBA1C 6.0 03/19/2019        Assessment & Plan:

## 2019-05-27 NOTE — Patient Instructions (Signed)
Ok to stop the [place]  Please take all new medication as prescribed  The adderrall, and trazodone for sleep  You will be contacted regarding the referral for: ADD clinic  You had the B12 shot today -  Remember you only need 6 months of these shots, then change to OTC B12 - 1 per day supplement  Please continue all other medications as before, including the VItamin D  Please have the pharmacy call with any other refills you may need.  Please continue your efforts at being more active, low cholesterol diet, and weight control.  Please keep your appointments with your specialists as you may have planned  Please return in 3 months, or sooner if needed

## 2019-06-06 ENCOUNTER — Encounter: Payer: Self-pay | Admitting: Internal Medicine

## 2019-06-06 ENCOUNTER — Other Ambulatory Visit: Payer: Self-pay | Admitting: Internal Medicine

## 2019-06-06 NOTE — Assessment & Plan Note (Signed)
stable overall by history and exam, recent data reviewed with pt, and pt to continue medical treatment as before,  to f/u any worsening symptoms or concerns, cont statin 

## 2019-06-06 NOTE — Assessment & Plan Note (Signed)
stable overall by history and exam, recent data reviewed with pt, and pt to continue medical treatment as before,  to f/u any worsening symptoms or concerns  

## 2019-06-06 NOTE — Assessment & Plan Note (Addendum)
Ok for trial adderall 20 bid, and refer ADD clinic for formal diagnosis  Note:  Total time for pt hx, exam, review of record with pt in the room, determination of diagnoses and plan for further eval and tx is > 40 min, with over 50% spent in coordination and counseling of patient including the differential dx, tx, further evaluation and other management of ADD, insomnia, HLD, HTN, hyperglycemia, umbilical hernia, [id number] deficiency, vit d deficiency,

## 2019-06-06 NOTE — Assessment & Plan Note (Signed)
Minor symptoms, benign exam, cont to follow for any worsening

## 2019-06-06 NOTE — Assessment & Plan Note (Signed)
Ok to change the lunsesta to trazodone 50 qhs prn,  to f/u any worsening symptoms or concerns

## 2019-06-06 NOTE — Assessment & Plan Note (Signed)
Ok to change to oral daily 2000 u qd

## 2019-06-06 NOTE — Telephone Encounter (Signed)
No need refill  Please change to OTC Vitamin D3 at 2000 units per day, indefinitely.

## 2019-06-06 NOTE — Assessment & Plan Note (Signed)
For b12 1000 mg IM today 

## 2019-06-25 ENCOUNTER — Encounter: Payer: Self-pay | Admitting: Internal Medicine

## 2019-06-25 ENCOUNTER — Other Ambulatory Visit: Payer: Self-pay | Admitting: Internal Medicine

## 2019-06-25 MED ORDER — AMPHETAMINE-DEXTROAMPHETAMINE 20 MG PO TABS: 20.0000 mg | ORAL_TABLET | Freq: Two times a day (BID) | ORAL | 0 refills | Status: DC

## 2019-06-25 NOTE — Telephone Encounter (Signed)
Done erx 

## 2019-06-26 MED ORDER — AMPHETAMINE-DEXTROAMPHETAMINE 20 MG PO TABS: 20.0000 mg | ORAL_TABLET | Freq: Two times a day (BID) | ORAL | 0 refills | Status: DC

## 2019-06-29 ENCOUNTER — Other Ambulatory Visit: Payer: Self-pay

## 2019-06-29 ENCOUNTER — Ambulatory Visit (INDEPENDENT_AMBULATORY_CARE_PROVIDER_SITE_OTHER): Payer: BC Managed Care – PPO | Admitting: *Deleted

## 2019-06-29 DIAGNOSIS — E538 Deficiency of other specified B group vitamins: Secondary | ICD-10-CM | POA: Diagnosis not present

## 2019-06-29 MED ORDER — CYANOCOBALAMIN 1000 MCG/ML IJ SOLN
1000.0000 ug | Freq: Once | INTRAMUSCULAR | Status: AC
  Administered 2019-06-29: 1000 ug via INTRAMUSCULAR

## 2019-07-25 ENCOUNTER — Other Ambulatory Visit: Payer: Self-pay | Admitting: Internal Medicine

## 2019-07-26 MED ORDER — AMPHETAMINE-DEXTROAMPHETAMINE 20 MG PO TABS: 20.0000 mg | ORAL_TABLET | Freq: Two times a day (BID) | ORAL | 0 refills | Status: DC

## 2019-07-31 ENCOUNTER — Ambulatory Visit (INDEPENDENT_AMBULATORY_CARE_PROVIDER_SITE_OTHER): Payer: BC Managed Care – PPO | Admitting: *Deleted

## 2019-07-31 ENCOUNTER — Other Ambulatory Visit: Payer: Self-pay

## 2019-07-31 DIAGNOSIS — E538 Deficiency of other specified B group vitamins: Secondary | ICD-10-CM

## 2019-07-31 MED ORDER — CYANOCOBALAMIN 1000 MCG/ML IJ SOLN
1000.0000 ug | Freq: Once | INTRAMUSCULAR | Status: AC
  Administered 2019-07-31: 1000 ug via INTRAMUSCULAR

## 2019-07-31 NOTE — Progress Notes (Signed)
Medical screening examination/treatment/procedure(s) were performed by non-physician practitioner and as supervising physician I was immediately available for consultation/collaboration. I agree with above. Obadiah Dennard, MD   

## 2019-08-22 DIAGNOSIS — H15002 Unspecified scleritis, left eye: Secondary | ICD-10-CM | POA: Diagnosis not present

## 2019-08-23 ENCOUNTER — Other Ambulatory Visit: Payer: Self-pay | Admitting: Internal Medicine

## 2019-08-24 MED ORDER — AMPHETAMINE-DEXTROAMPHETAMINE 20 MG PO TABS: 20.0000 mg | ORAL_TABLET | Freq: Two times a day (BID) | ORAL | 0 refills | Status: DC

## 2019-08-24 NOTE — Telephone Encounter (Signed)
Done erx 

## 2019-08-26 ENCOUNTER — Encounter: Payer: Self-pay | Admitting: Internal Medicine

## 2019-08-26 ENCOUNTER — Telehealth: Payer: Self-pay | Admitting: Internal Medicine

## 2019-08-26 ENCOUNTER — Other Ambulatory Visit: Payer: Self-pay

## 2019-08-26 ENCOUNTER — Ambulatory Visit: Payer: BC Managed Care – PPO | Admitting: Internal Medicine

## 2019-08-26 VITALS — BP 140/80 | HR 106 | Temp 98.1°F | Ht 67.0 in | Wt 275.0 lb

## 2019-08-26 DIAGNOSIS — E785 Hyperlipidemia, unspecified: Secondary | ICD-10-CM | POA: Diagnosis not present

## 2019-08-26 DIAGNOSIS — R739 Hyperglycemia, unspecified: Secondary | ICD-10-CM

## 2019-08-26 DIAGNOSIS — E538 Deficiency of other specified B group vitamins: Secondary | ICD-10-CM | POA: Diagnosis not present

## 2019-08-26 DIAGNOSIS — M65331 Trigger finger, right middle finger: Secondary | ICD-10-CM | POA: Diagnosis not present

## 2019-08-26 DIAGNOSIS — F988 Other specified behavioral and emotional disorders with onset usually occurring in childhood and adolescence: Secondary | ICD-10-CM

## 2019-08-26 DIAGNOSIS — E559 Vitamin D deficiency, unspecified: Secondary | ICD-10-CM | POA: Diagnosis not present

## 2019-08-26 LAB — LIPID PANEL
Cholesterol: 122 mg/dL (ref 0–200)
HDL: 39.1 mg/dL (ref >39.00)
LDL Cholesterol: 66 mg/dL (ref 0–99)
NonHDL: 83.2
Total CHOL/HDL Ratio: 3
Triglycerides: 88 mg/dL (ref 0.0–149.0)
VLDL: 17.6 mg/dL (ref 0.0–40.0)

## 2019-08-26 LAB — HEMOGLOBIN A1C: Hgb A1c MFr Bld: 5.8 % (ref 4.6–6.5)

## 2019-08-26 LAB — BASIC METABOLIC PANEL
BUN: 6 mg/dL (ref 6–23)
CO2: 29 mEq/L (ref 19–32)
Calcium: 9.5 mg/dL (ref 8.4–10.5)
Chloride: 103 mEq/L (ref 96–112)
Creatinine, Ser: 0.96 mg/dL (ref 0.40–1.50)
GFR: 84.19 mL/min (ref >60.00)
Glucose, Bld: 128 mg/dL — ABNORMAL HIGH (ref 70–99)
Potassium: 3.7 mEq/L (ref 3.5–5.1)
Sodium: 137 mEq/L (ref 135–145)

## 2019-08-26 LAB — HEPATIC FUNCTION PANEL
ALT: 26 U/L (ref 0–53)
AST: 19 U/L (ref 0–37)
Albumin: 4.2 g/dL (ref 3.5–5.2)
Alkaline Phosphatase: 81 U/L (ref 39–117)
Bilirubin, Direct: 0.1 mg/dL (ref 0.0–0.3)
Total Bilirubin: 0.4 mg/dL (ref 0.2–1.2)
Total Protein: 7.1 g/dL (ref 6.0–8.3)

## 2019-08-26 MED ORDER — AMPHETAMINE-DEXTROAMPHETAMINE 20 MG PO TABS: 20.0000 mg | ORAL_TABLET | Freq: Every day | ORAL | 0 refills | Status: DC

## 2019-08-26 MED ORDER — AMPHETAMINE-DEXTROAMPHET ER 30 MG PO CP24: 30.0000 mg | ORAL_CAPSULE | ORAL | 0 refills | Status: DC

## 2019-08-26 MED ORDER — VITAMIN B-12 1000 MCG PO TABS: 1000.0000 ug | ORAL_TABLET | Freq: Every day | ORAL | 3 refills | Status: DC

## 2019-08-26 NOTE — Patient Instructions (Signed)
Ok to adjust the adderall on Sep 22 2019 to:  adderall ER 30 mg per day in the AM, and adderall 20 mg at 2 PM  Ok to change the B12 shots to the pills - done as presscription today  Please continue all other medications as before, including the Vit D3 at 2000 units per day  Please have the pharmacy call with any other refills you may need.  Please continue your efforts at being more active, low cholesterol diet, and weight control.  Please keep your appointments with your specialists as you may have planned  You will be contacted regarding the referral for: [facility]  Please go to the LAB at the blood drawing area for the tests to be done  You will be contacted by phone if any changes need to be made immediately.  Otherwise, you will receive a letter about your results with an explanation, but please check with MyChart first.  Please remember to sign up for MyChart if you have not done so, as this will be important to you in the future with finding out test results, communicating by private email, and scheduling acute appointments online when needed.  Please make an Appointment to return in 6 months, or sooner if needed

## 2019-08-26 NOTE — Assessment & Plan Note (Signed)
stable overall by history and exam, recent data reviewed with pt, and pt to continue medical treatment as before,  to f/u any worsening symptoms or concerns  

## 2019-08-26 NOTE — Telephone Encounter (Signed)
Pt has several things to address, which I will get clarification on when I call him.  However, in the mean time, pt is requesting you to take over the rx Modafinil 200 MG. Please advise.

## 2019-08-26 NOTE — Telephone Encounter (Signed)
Patient was seen today and forgot to ask questions when you get a free moment is there anyway that you can call him back and answer them?   Was referred to a hand surgeon, and was wondering if there was any way that maybe he not have to have surgery but an alternate solution first. He was wondering if surgery could be the last option.    Prescription for Modafinil 200 MG twice daily. Pt was wondering if Dr. [name] could start prescribing the medication, he get sit through a different doctor.

## 2019-08-26 NOTE — Assessment & Plan Note (Addendum)
Ok for adderall er 30 qam and 20 qpm  I spent 35 minutes in preparing to see the patient by review of recent labs, imaging and procedures, obtaining and reviewing separately obtained history, communicating with the patient and family or caregiver, ordering medications, tests or procedures, and documenting clinical information in the EHR including the differential Dx, treatment, and any further evaluation and other management of trigger finger, hyperglycemia, HLD, b12 and D deficiency,

## 2019-08-26 NOTE — Progress Notes (Signed)
Subjective:    Patient ID: Bryan Barber, male    DOB: [birth year], 49 y.o.   MRN: [id number]  HPI  Here to f/u; overall doing ok,  Pt denies chest pain, increasing sob or doe, wheezing, orthopnea, PND, increased LE swelling, palpitations, dizziness or syncope.  Pt denies new neurological symptoms such as new headache, or facial or extremity weakness or numbness.  Pt denies polydipsia, polyuria, or low sugar episode.  Pt states overall good compliance with meds, mostly trying to follow appropriate diet, with wt overall stable,  but little exercise however. Has prior low vit d and b12.  Tolerating new statin.  Has 3 mo right middle finger trigger like worse in the ams.  Adderall working well but needs just a bit more for later in the day Past Medical History:  Diagnosis Date  . Allergic rhinitis   . Asthma   . EE (eosinophilic esophagitis) 99991111  . GERD (gastroesophageal reflux disease) 08/20/2011  . HLD (hyperlipidemia)   . Hypertension   . Multiple gastric polyps 08/20/2011  . Obesity   . OSA on CPAP    Past Surgical History:  Procedure Laterality Date  . ESOPHAGOGASTRODUODENOSCOPY (EGD) WITH ESOPHAGEAL DILATION  08/20/2011  . WISDOM TOOTH EXTRACTION    . WRIST SURGERY  10/2010   cyst removed. Right wrist    reports that he quit smoking about 24 years ago. His smoking use included cigarettes. He has a 10.00 pack-year smoking history. He has never used smokeless tobacco. He reports that he does not drink alcohol or use drugs. family history includes Atrial fibrillation in his father; Cancer in his maternal grandmother; Heart disease in his paternal grandfather; Hypertension in his father; Pancreatic cancer in his maternal grandfather; Sleep apnea in his mother. Allergies  Allergen Reactions  . Aspirin     Face swelling    Current Outpatient Medications on File Prior to Visit  Medication Sig Dispense Refill  . albuterol (PROAIR HFA) 108 (90 Base) MCG/ACT inhaler Inhale 2 puffs into  the lungs every 6 (six) hours as needed. 3 Inhaler 3  . amLODipine (NORVASC) 10 MG tablet Take 1 tablet (10 mg total) by mouth daily. 90 tablet 3  . losartan (COZAAR) 100 MG tablet Take 1 tablet (100 mg total) by mouth daily. 90 tablet 3  . modafinil (PROVIGIL) 200 MG tablet Take 2 tablets (400 mg total) by mouth daily. 60 tablet 5  . rosuvastatin (CRESTOR) 20 MG tablet Take 1 tablet (20 mg total) by mouth daily. 90 tablet 3  . traZODone (DESYREL) 50 MG tablet Take 0.5-1 tablets (25-50 mg total) by mouth at bedtime as needed for sleep. 90 tablet 1  . Fluticasone-Salmeterol (ADVAIR DISKUS) 250-50 MCG/DOSE AEPB Inhale 1 puff into the lungs 2 (two) times daily. (Patient not taking: Reported on 08/26/2019) 1 each 6  . Vitamin D, Ergocalciferol, (DRISDOL) 1.25 MG (50000 UT) CAPS capsule TAKE 1 CAPSULE (50,000 UNITS TOTAL) BY MOUTH EVERY 7 (SEVEN) DAYS. (Patient not taking: Reported on 08/26/2019) 12 capsule 0   No current facility-administered medications on file prior to visit.   Review of Systems All otherwise neg per pt     Objective:   Physical Exam BP 140/80   Pulse (!) 106   Temp 98.1 F (36.7 C)   Ht 5\' 7"  (1.702 m)   Wt 275 lb (124.7 kg)   SpO2 99%   BMI 43.07 kg/m  VS noted,  Constitutional: Pt appears in NAD HENT: Head: NCAT.  Right Ear:  External ear normal.  Left Ear: External ear normal.  Eyes: . Pupils are equal, round, and reactive to light. Conjunctivae and EOM are normal Nose: without d/c or deformity Neck: Neck supple. Gross normal ROM Cardiovascular: Normal rate and regular rhythm.   Pulmonary/Chest: Effort normal and breath sounds without rales or wheezing.  + right middle finger trigger PIP joint Neurological: Pt is alert. At baseline orientation, motor grossly intact Skin: Skin is warm. No rashes, other new lesions, no LE edema Psychiatric: Pt behavior is normal without agitation  All otherwise neg per pt Lab Results  Component Value Date   WBC 7.4 03/19/2019     HGB 15.3 03/19/2019   HCT 44.9 03/19/2019   PLT 269.0 03/19/2019   GLUCOSE 128 (H) 08/26/2019   CHOL 122 08/26/2019   TRIG 88.0 08/26/2019   HDL 39.10 08/26/2019   LDLDIRECT 183.0 03/19/2019   LDLCALC 66 08/26/2019   ALT 26 08/26/2019   AST 19 08/26/2019   NA 137 08/26/2019   K 3.7 08/26/2019   CL 103 08/26/2019   CREATININE 0.96 08/26/2019   BUN 6 08/26/2019   CO2 29 08/26/2019   TSH 1.94 03/19/2019   PSA 0.53 03/19/2019   HGBA1C 5.8 08/26/2019      Assessment & Plan:

## 2019-08-26 NOTE — Assessment & Plan Note (Signed)
For oral replacement 

## 2019-08-26 NOTE — Assessment & Plan Note (Signed)
For hand surgury referral

## 2019-08-27 NOTE — Telephone Encounter (Signed)
I would prefer Dr Halford Chessman to continue with the modafinil  Also unfortuantely PT not likley to help with the trigger finger, so I wouldn't have much else to offer

## 2019-08-27 NOTE — Telephone Encounter (Signed)
Left detailed message for pt with PCP response and recommendation. Informed pt to call me back if he wanted to discuss any further.

## 2019-09-01 ENCOUNTER — Ambulatory Visit: Payer: BC Managed Care – PPO | Admitting: Family Medicine

## 2019-09-01 ENCOUNTER — Encounter: Payer: Self-pay | Admitting: Family Medicine

## 2019-09-01 ENCOUNTER — Other Ambulatory Visit: Payer: Self-pay

## 2019-09-01 DIAGNOSIS — M65331 Trigger finger, right middle finger: Secondary | ICD-10-CM

## 2019-09-01 NOTE — Progress Notes (Signed)
Office Visit Note   Patient: Bryan Barber           Date of Birth: [birth year]           MRN: [id number] Visit Date: 09/01/2019 Requested by: Biagio Borg, MD [place],  [place] [id number] PCP: Biagio Borg, MD  Subjective: Chief Complaint  Patient presents with  . Right Middle Finger - Pain    Trigger finger right middle finger x 1-2 months. Hurts. Not had this before, but has had numbness in the hands before.    HPI: He is here with right third finger pain.  He is right-hand dominant.  Symptoms started about 3 months ago, no injury.  He does a lot of repetitive activities at home.  He works from home on a computer as well.  His finger gets stuck in flexion periodically throughout the day.  He has not done anything specifically to treat it.  Incidentally in the left hand he has a Dupuytren's contracture.  This has been present for several years but does not bother him too often unless he is holding his phone in his hand for a long time.  He wondered whether it might be related to his right hand.              ROS: No diabetes, no thyroid dysfunction.  He had B12 deficiency.  All other systems were reviewed and are negative.  Objective: Vital Signs: There were no vitals taken for this visit.  Physical Exam:  General:  Alert and oriented, in no acute distress. Pulm:  Breathing unlabored. Psy:  Normal mood, congruent affect. Skin: No erythema. Right hand: He has a tender nodule at the third finger A1 pulley and it triggers slightly in flexion.  Full active range of motion still.  Imaging: None today  Assessment & Plan: 1.  Right third trigger finger -We discussed various treatment options and he wants to try an injection.  If this does not help, then occupational therapy.     Procedures: Right hand injection: After sterile prep with Betadine, injected 2 cc 1% lidocaine without epinephrine and 20 mg methylprednisolone into the third finger A1  pulley.    PMFS History: Patient Active Problem List   Diagnosis Date Noted  . Trigger middle finger of right hand 08/26/2019  . ADD (attention deficit disorder) 05/27/2019  . B12 deficiency 05/27/2019  . Vitamin D deficiency 05/27/2019  . Insomnia 03/19/2019  . Diarrhea 12/04/2017  . Encounter for well adult exam with abnormal findings 08/30/2017  . Tachycardia 08/30/2017  . Umbilical hernia [id number]  . Hyperglycemia 08/30/2017  . Carpal tunnel syndrome 08/23/2016  . Hypersomnia with sleep apnea 07/30/2016  . Jaw pain 12/05/2015  . Stye 12/05/2015  . Shift work sleep disorder 09/12/2015  . Essential hypertension 09/12/2015  . Obesity, Class II, BMI 35-39.9, with comorbidity 09/21/2013  . BMI 36.0-36.9,adult 10/22/2011  . Seasonal allergic rhinitis 10/08/2011  . Eosinophilic esophagitis 0000000  .  GERD - erosive esophagitis 08/13/2011  . Inadequate sleep hygiene 11/21/2010  . HLD (hyperlipidemia) 01/25/2009  . OBSESSIVE-COMPULSIVE DISORDER 01/25/2009  . Obstructive sleep apnea 10/14/2007  . Asthma 07/22/2007   Past Medical History:  Diagnosis Date  . Allergic rhinitis   . Asthma   . EE (eosinophilic esophagitis) 99991111  . GERD (gastroesophageal reflux disease) 08/20/2011  . HLD (hyperlipidemia)   . Hypertension   . Multiple gastric polyps 08/20/2011  . Obesity   . OSA on CPAP  Family History  Problem Relation Age of Onset  . Hypertension Father   . Atrial fibrillation Father   . Sleep apnea Mother   . Heart disease Paternal Grandfather   . Pancreatic cancer Maternal Grandfather   . Cancer Maternal Grandmother   . Colon cancer Neg Hx     Past Surgical History:  Procedure Laterality Date  . ESOPHAGOGASTRODUODENOSCOPY (EGD) WITH ESOPHAGEAL DILATION  08/20/2011  . WISDOM TOOTH EXTRACTION    . WRIST SURGERY  10/2010   cyst removed. Right wrist   Social History   Occupational History  . Occupation: Health visitor: [employer]  Tobacco Use  .  Smoking status: Former Smoker    Packs/day: 1.00    Years: 10.00    Pack years: 10.00    Types: Cigarettes    Quit date: 06/05/1995    Years since quitting: 24.2  . Smokeless tobacco: Never Used  Substance and Sexual Activity  . Alcohol use: No  . Drug use: No  . Sexual activity: Not on file    Comment: Long time ago

## 2019-09-29 ENCOUNTER — Encounter: Payer: Self-pay | Admitting: Internal Medicine

## 2019-09-29 MED ORDER — AMPHETAMINE-DEXTROAMPHETAMINE 30 MG PO TABS: 30.0000 mg | ORAL_TABLET | Freq: Every day | ORAL | 0 refills | Status: DC

## 2019-09-29 MED ORDER — AMPHETAMINE-DEXTROAMPHETAMINE 20 MG PO TABS: 20.0000 mg | ORAL_TABLET | Freq: Every day | ORAL | 0 refills | Status: DC

## 2019-10-05 ENCOUNTER — Other Ambulatory Visit: Payer: Self-pay | Admitting: Internal Medicine

## 2019-10-05 DIAGNOSIS — J4520 Mild intermittent asthma, uncomplicated: Secondary | ICD-10-CM

## 2019-10-05 NOTE — Telephone Encounter (Signed)
Please refill as per office routine med refill policy (all routine meds refilled for 3 mo or monthly per pt preference up to one year from last visit, then month to month grace period for 3 mo, then further med refills will have to be denied)  

## 2019-11-13 ENCOUNTER — Other Ambulatory Visit: Payer: Self-pay | Admitting: Internal Medicine

## 2019-11-14 MED ORDER — AMPHETAMINE-DEXTROAMPHETAMINE 20 MG PO TABS: 20.0000 mg | ORAL_TABLET | Freq: Every day | ORAL | 0 refills | Status: DC

## 2019-11-14 MED ORDER — AMPHETAMINE-DEXTROAMPHETAMINE 30 MG PO TABS: 30.0000 mg | ORAL_TABLET | Freq: Every day | ORAL | 0 refills | Status: DC

## 2019-11-14 NOTE — Telephone Encounter (Signed)
Done erx 

## 2019-12-16 ENCOUNTER — Other Ambulatory Visit: Payer: Self-pay | Admitting: Internal Medicine

## 2019-12-16 MED ORDER — AMPHETAMINE-DEXTROAMPHETAMINE 20 MG PO TABS: 20.0000 mg | ORAL_TABLET | Freq: Every day | ORAL | 0 refills | Status: DC

## 2019-12-16 MED ORDER — AMPHETAMINE-DEXTROAMPHETAMINE 30 MG PO TABS: 30.0000 mg | ORAL_TABLET | Freq: Every day | ORAL | 0 refills | Status: DC

## 2019-12-16 NOTE — Telephone Encounter (Signed)
Done erx 

## 2019-12-17 ENCOUNTER — Encounter: Payer: Self-pay | Admitting: Internal Medicine

## 2019-12-17 MED ORDER — CITALOPRAM HYDROBROMIDE 20 MG PO TABS: 20.0000 mg | ORAL_TABLET | Freq: Every day | ORAL | 3 refills | Status: DC

## 2019-12-17 MED ORDER — BUSPIRONE HCL 10 MG PO TABS: 10.0000 mg | ORAL_TABLET | Freq: Three times a day (TID) | ORAL | 1 refills | Status: DC

## 2019-12-21 ENCOUNTER — Ambulatory Visit: Payer: BC Managed Care – PPO | Admitting: Internal Medicine

## 2019-12-21 DIAGNOSIS — Z0289 Encounter for other administrative examinations: Secondary | ICD-10-CM

## 2019-12-22 ENCOUNTER — Ambulatory Visit: Payer: BC Managed Care – PPO | Admitting: Internal Medicine

## 2019-12-25 ENCOUNTER — Ambulatory Visit: Payer: BC Managed Care – PPO | Admitting: Internal Medicine

## 2019-12-25 ENCOUNTER — Other Ambulatory Visit: Payer: Self-pay

## 2019-12-25 ENCOUNTER — Encounter: Payer: Self-pay | Admitting: Internal Medicine

## 2019-12-25 VITALS — BP 110/70 | HR 104 | Temp 98.3°F | Ht 67.0 in | Wt 243.0 lb

## 2019-12-25 DIAGNOSIS — F410 Panic disorder [episodic paroxysmal anxiety] without agoraphobia: Secondary | ICD-10-CM | POA: Diagnosis not present

## 2019-12-25 DIAGNOSIS — Z72821 Inadequate sleep hygiene: Secondary | ICD-10-CM

## 2019-12-25 DIAGNOSIS — J3089 Other allergic rhinitis: Secondary | ICD-10-CM | POA: Diagnosis not present

## 2019-12-25 DIAGNOSIS — R7303 Prediabetes: Secondary | ICD-10-CM | POA: Diagnosis not present

## 2019-12-25 DIAGNOSIS — I10 Essential (primary) hypertension: Secondary | ICD-10-CM

## 2019-12-25 MED ORDER — FEXOFENADINE HCL 180 MG PO TABS: 180.0000 mg | ORAL_TABLET | Freq: Every day | ORAL | 11 refills | Status: AC

## 2019-12-25 MED ORDER — ZOLPIDEM TARTRATE 10 MG PO TABS
10.0000 mg | ORAL_TABLET | Freq: Every evening | ORAL | 5 refills | Status: DC | PRN
Start: 2019-12-25 — End: 2020-01-07

## 2019-12-25 MED ORDER — CLONAZEPAM 1 MG PO TABS: ORAL_TABLET | ORAL | 1 refills | Status: DC

## 2019-12-25 MED ORDER — TRIAMCINOLONE ACETONIDE 55 MCG/ACT NA AERO
2.0000 | INHALATION_SPRAY | Freq: Every day | NASAL | 12 refills | Status: AC
Start: 2019-12-25 — End: ?

## 2019-12-25 NOTE — Progress Notes (Signed)
Subjective:    Patient ID: Bryan Barber, male    DOB: [birth year], 49 y.o.   MRN: [id number]  HPI  Here with worsening panic attack new for him in the psat 3 wks with several, has not gone to Ed but had family stay with him, much more difficulty getting to sleep  .  Buspar not working well.   Friend has covid and he cant stop worrying about it.  Does have several wks ongoing nasal allergy symptoms with clearish congestion, itch and sneezing, without fever, pain, ST, cough, swelling or wheezing.  Pt denies chest pain, increased sob or doe, wheezing, orthopnea, PND, increased LE swelling, palpitations, dizziness or syncope.  Pt denies new neurological symptoms such as new headache, or facial or extremity weakness or numbness   Pt denies polydipsia, polyuria Past Medical History:  Diagnosis Date   Allergic rhinitis    Asthma    EE (eosinophilic esophagitis) 2/62/0355   GERD (gastroesophageal reflux disease) 08/20/2011   HLD (hyperlipidemia)    Hypertension    Multiple gastric polyps 08/20/2011   Obesity    OSA on CPAP    Past Surgical History:  Procedure Laterality Date   ESOPHAGOGASTRODUODENOSCOPY (EGD) WITH ESOPHAGEAL DILATION  08/20/2011   WISDOM TOOTH EXTRACTION     WRIST SURGERY  10/2010   cyst removed. Right wrist    reports that he quit smoking about 24 years ago. His smoking use included cigarettes. He has a 10.00 pack-year smoking history. He has never used smokeless tobacco. He reports that he does not drink alcohol and does not use drugs. family history includes Atrial fibrillation in his father; Cancer in his maternal grandmother; Heart disease in his paternal grandfather; Hypertension in his father; Pancreatic cancer in his maternal grandfather; Sleep apnea in his mother. Allergies  Allergen Reactions   Aspirin     Face swelling    Current Outpatient Medications on File Prior to Visit  Medication Sig Dispense Refill   albuterol (VENTOLIN HFA) 108 (90 Base)  MCG/ACT inhaler TAKE 2 PUFFS BY MOUTH EVERY 6 HOURS AS NEEDED 8.5 g 3   amLODipine (NORVASC) 10 MG tablet Take 1 tablet (10 mg total) by mouth daily. 90 tablet 3   amphetamine-dextroamphetamine (ADDERALL) 20 MG tablet Take 1 tablet (20 mg total) by mouth daily. At 2 PM 30 tablet 0   amphetamine-dextroamphetamine (ADDERALL) 30 MG tablet Take 1 tablet by mouth daily before breakfast. 30 tablet 0   citalopram (CELEXA) 20 MG tablet Take 1 tablet (20 mg total) by mouth daily. 90 tablet 3   losartan (COZAAR) 100 MG tablet Take 1 tablet (100 mg total) by mouth daily. 90 tablet 3   modafinil (PROVIGIL) 200 MG tablet Take 2 tablets (400 mg total) by mouth daily. 60 tablet 5   rosuvastatin (CRESTOR) 20 MG tablet Take 1 tablet (20 mg total) by mouth daily. 90 tablet 3   traZODone (DESYREL) 50 MG tablet Take 0.5-1 tablets (25-50 mg total) by mouth at bedtime as needed for sleep. 90 tablet 1   vitamin B-12 (CYANOCOBALAMIN) 1000 MCG tablet Take 1 tablet (1,000 mcg total) by mouth daily. 90 tablet 3   No current facility-administered medications on file prior to visit.   Review of Systems All otherwise neg per pt     Objective:   Physical Exam BP 110/70 (BP Location: Left Arm, Patient Position: Sitting, Cuff Size: Large)    Pulse 104    Temp 98.3 F (36.8 C) (Oral)    Ht  5\' 7"  (1.702 m)    Wt (!) 243 lb (110.2 kg)    SpO2 97%    BMI 38.06 kg/m  VS noted,  Constitutional: Pt appears in NAD HENT: Head: NCAT.  Right Ear: External ear normal.  Left Ear: External ear normal.  Eyes: . Pupils are equal, round, and reactive to light. Conjunctivae and EOM are normal Nose: without d/c or deformity Neck: Neck supple. Gross normal ROM Cardiovascular: Normal rate and regular rhythm.   Pulmonary/Chest: Effort normal and breath sounds without rales or wheezing.  Abd:  Soft, NT, ND, + BS, no organomegaly Neurological: Pt is alert. At baseline orientation, motor grossly intact Skin: Skin is warm. No  rashes, other new lesions, no LE edema Psychiatric: Pt behavior is normal without agitation , 2+ nervous All otherwise neg per pt Lab Results  Component Value Date   WBC 7.4 03/19/2019   HGB 15.3 03/19/2019   HCT 44.9 03/19/2019   PLT 269.0 03/19/2019   GLUCOSE 128 (H) 08/26/2019   CHOL 122 08/26/2019   TRIG 88.0 08/26/2019   HDL 39.10 08/26/2019   LDLDIRECT 183.0 03/19/2019   LDLCALC 66 08/26/2019   ALT 26 08/26/2019   AST 19 08/26/2019   NA 137 08/26/2019   K 3.7 08/26/2019   CL 103 08/26/2019   CREATININE 0.96 08/26/2019   BUN 6 08/26/2019   CO2 29 08/26/2019   TSH 1.94 03/19/2019   PSA 0.53 03/19/2019   HGBA1C 5.8 08/26/2019      Assessment & Plan:

## 2019-12-25 NOTE — Patient Instructions (Signed)
Please take all new medication as prescribed - the klonopin, allegra and nasacort  Please take all new medication as prescribed - also the ambien   Please continue all other medications as before, and refills have been done if requested.  Please have the pharmacy call with any other refills you may need.  Please continue your efforts at being more active, low cholesterol diet, and weight control.  Please keep your appointments with your specialists as you may have planned

## 2019-12-26 ENCOUNTER — Encounter: Payer: Self-pay | Admitting: Internal Medicine

## 2019-12-26 DIAGNOSIS — F410 Panic disorder [episodic paroxysmal anxiety] without agoraphobia: Secondary | ICD-10-CM | POA: Insufficient documentation

## 2019-12-26 NOTE — Assessment & Plan Note (Signed)
stable overall by history and exam, recent data reviewed with pt, and pt to continue medical treatment as before,  to f/u any worsening symptoms or concerns  

## 2019-12-26 NOTE — Assessment & Plan Note (Signed)
Ok for [employer] and [employer]

## 2019-12-26 NOTE — Assessment & Plan Note (Signed)
Melatonin no help ata ll, trazodone no long helping, ok for ambien 10 qhs prn

## 2019-12-26 NOTE — Assessment & Plan Note (Addendum)
For klonopin bid prn,  to f/u any worsening symptoms or concerns  I spent 31 minutes in preparing to see the patient by review of recent labs, imaging and procedures, obtaining and reviewing separately obtained history, communicating with the patient and family or caregiver, ordering medications, tests or procedures, and documenting clinical information in the EHR including the differential Dx, treatment, and any further evaluation and other management of panic anxiety, insomnia, allergies, preDM, hTN,

## 2020-01-07 ENCOUNTER — Telehealth: Payer: Self-pay | Admitting: Primary Care

## 2020-01-07 ENCOUNTER — Encounter: Payer: Self-pay | Admitting: Primary Care

## 2020-01-07 ENCOUNTER — Ambulatory Visit: Payer: BC Managed Care – PPO | Admitting: Primary Care

## 2020-01-07 ENCOUNTER — Other Ambulatory Visit: Payer: Self-pay

## 2020-01-07 DIAGNOSIS — G4733 Obstructive sleep apnea (adult) (pediatric): Secondary | ICD-10-CM | POA: Diagnosis not present

## 2020-01-07 DIAGNOSIS — J3089 Other allergic rhinitis: Secondary | ICD-10-CM | POA: Diagnosis not present

## 2020-01-07 DIAGNOSIS — J4520 Mild intermittent asthma, uncomplicated: Secondary | ICD-10-CM | POA: Diagnosis not present

## 2020-01-07 DIAGNOSIS — R1310 Dysphagia, unspecified: Secondary | ICD-10-CM

## 2020-01-07 DIAGNOSIS — G4710 Hypersomnia, unspecified: Secondary | ICD-10-CM | POA: Diagnosis not present

## 2020-01-07 DIAGNOSIS — G4730 Sleep apnea, unspecified: Secondary | ICD-10-CM

## 2020-01-07 DIAGNOSIS — J029 Acute pharyngitis, unspecified: Secondary | ICD-10-CM

## 2020-01-07 MED ORDER — ALBUTEROL SULFATE HFA 108 (90 BASE) MCG/ACT IN AERS: INHALATION_SPRAY | RESPIRATORY_TRACT | 3 refills | Status: DC

## 2020-01-07 NOTE — Patient Instructions (Addendum)
Pleasure meeting you today Bryan Barber  OSA: - Continue her CPAP every night for 4 to 6 hours minimum - Do not drive if experiencing excessive daytime fatigue or somnolence - I will discuss modafinil prescription with Bryan Barber and get back to you on what he recommends.  Again this may not be indicated for use with Adderall.  - Please bring in SD card OR mail [place] copy of data so we can review CPAP compliance  Sinusitis: -Please take Allegra as prescribed once daily -Use Ocean nasal spray twice daily followed by Nasacort prior to bedtime -If no improvement in symptoms in 7 to 10 days with consistent use of medication as listed above please notify us or Bryan Barber, may consider trial antibiotic for acute sinusitis and/or referral to ENT   Rx: - Albuterol 2 puffs every 6 hours as needed for shortness of breath or wheezing  Follow-up: - 1 year with Bryan Barber or sooner if needed (please bring in SD card)   CPAP and BPAP Information CPAP and BPAP are methods of helping a person breathe with the use of air pressure. CPAP stands for "continuous positive airway pressure." BPAP stands for "bi-level positive airway pressure." In both methods, air is blown through your nose or mouth and into your air passages to help you breathe well. CPAP and BPAP use different amounts of pressure to blow air. With CPAP, the amount of pressure stays the same while you breathe in and out. With BPAP, the amount of pressure is increased when you breathe in (inhale) so that you can take larger breaths. Your health care provider will recommend whether CPAP or BPAP would be more helpful for you. Why are CPAP and BPAP treatments used? CPAP or BPAP can be helpful if you have:  Sleep apnea.  Chronic obstructive pulmonary disease (COPD).  Heart failure.  Medical conditions that weaken the muscles of the chest including muscular dystrophy, or neurological diseases such as amyotrophic lateral sclerosis (ALS).  Other problems  that cause breathing to be weak, abnormal, or difficult. CPAP is most commonly used for obstructive sleep apnea (OSA) to keep the airways from collapsing when the muscles relax during sleep. How is CPAP or BPAP administered? Both CPAP and BPAP are provided by a small machine with a flexible plastic tube that attaches to a plastic mask. You wear the mask. Air is blown through the mask into your nose or mouth. The amount of pressure that is used to blow the air can be adjusted on the machine. Your health care provider will determine the pressure setting that should be used based on your individual needs. When should CPAP or BPAP be used? In most cases, the mask only needs to be worn during sleep. Generally, the mask needs to be worn throughout the night and during any daytime naps. People with certain medical conditions may also need to wear the mask at other times when they are awake. Follow instructions from your health care provider about when to use the machine. What are some tips for using the mask?   Because the mask needs to be snug, some people feel trapped or closed-in (claustrophobic) when first using the mask. If you feel this way, you may need to get used to the mask. One way to do this is by holding the mask loosely over your nose or mouth and then gradually applying the mask more snugly. You can also gradually increase the amount of time that you use the mask.  Masks  are available in various types and sizes. Some fit over your mouth and nose while others fit over just your nose. If your mask does not fit well, talk with your health care provider about getting a different one.  If you are using a mask that fits over your nose and you tend to breathe through your mouth, a chin strap may be applied to help keep your mouth closed.  The CPAP and BPAP machines have alarms that may sound if the mask comes off or develops a leak.  If you have trouble with the mask, it is very important that you  talk with your health care provider about finding a way to make the mask easier to tolerate. Do not stop using the mask. Stopping the use of the mask could have a negative impact on your health. What are some tips for using the machine?  Place your CPAP or BPAP machine on a secure table or stand near an electrical outlet.  Know where the on/off switch is located on the machine.  Follow instructions from your health care provider about how to set the pressure on your machine and when you should use it.  Do not eat or drink while the CPAP or BPAP machine is on. Food or fluids could get pushed into your lungs by the pressure of the CPAP or BPAP.  Do not smoke. Tobacco smoke residue can damage the machine.  For home use, CPAP and BPAP machines can be rented or purchased through home health care companies. Many different brands of machines are available. Renting a machine before purchasing may help you find out which particular machine works well for you.  Keep the CPAP or BPAP machine and attachments clean. Ask your health care provider for specific instructions. Get help right away if:  You have redness or open areas around your nose or mouth where the mask fits.  You have trouble using the CPAP or BPAP machine.  You cannot tolerate wearing the CPAP or BPAP mask.  You have pain, discomfort, and bloating in your abdomen. Summary  CPAP and BPAP are methods of helping a person breathe with the use of air pressure.  Both CPAP and BPAP are provided by a small machine with a flexible plastic tube that attaches to a plastic mask.  If you have trouble with the mask, it is very important that you talk with your health care provider about finding a way to make the mask easier to tolerate. This information is not intended to replace advice given to you by your health care provider. Make sure you discuss any questions you have with your health care provider. Document Revised: 09/10/2018 Document  Reviewed: 04/09/2016 Elsevier Patient Education  [place].

## 2020-01-07 NOTE — Assessment & Plan Note (Signed)
-   Patient reports compliance with CPAP use, he did not bring in SD card today. We will need this to verify that he is using appropriate and ensure no changes need to be made - DME company is [employer]

## 2020-01-07 NOTE — Telephone Encounter (Signed)
Sleep patient of yours. He was getting Provigil from you, last filled in February. He was started on Adderrall by PCP in December 2020. I told him I would need to check with you if ok to fill. His Epworth is only 3/24 and he is not taking naps more than once a week. He was prescribed ambien by Dr. [name] but is not taking, I removed from his list. He is also taking Clonazepam as needed for anxiety but is trying to taper off this.   -beth

## 2020-01-07 NOTE — Assessment & Plan Note (Addendum)
-   Patient was prescribed Provigil for day sleepiness, however, he is currently taking Adderrall for ADD symptoms prescribed by PCP. I will discuss if refill is appropriate with Dr. Halford Chessman. His epworth score is low. No RX provided today.

## 2020-01-07 NOTE — Assessment & Plan Note (Addendum)
-   Patient reports sinusitis symptoms are causing some difficulty at night esp when using CPAP. Encourage patient to take Zyrtec 10mg  once daily and Nasacort at bedtime as prescribed regularly. Advised he add ocean nasal spray twice daily. If not improvement in acute sinusitis symptoms may consider abx or referral to ENT

## 2020-01-07 NOTE — Progress Notes (Signed)
@Patient  ID: Bryan Barber, male    DOB: [birth year], 49 y.o.   MRN: [id number]  Chief Complaint  Patient presents with  . Acute Visit    C/o nasal congestion x 2 wks., CPAP not working as well due to this    Referring provider: Biagio Borg, MD  HPI: 49 year old male, former smoker quit 1997 (10-pack-year history).  Past medical history significant for obstructive sleep apnea,  Hypersomnia, mild intermittent asthma, hypertension, GERD, eosinophilic esophagitis, ADD, obesity, panic anxiety syndrome, prediabetes.  Patient of Dr. Halford Chessman, last seen August 2020. PSG in November 2006 showed severe obstructive sleep apnea, AHI 60/hr. Maintained on CPAP. DME company is [facility] [employer].   01/07/2020- interim hx Patient presents today for annual follow-up for OSA. He reports compliance with CPAP, he did not bring in SD card today for compliance check. States that he can not sleep without using his CPAP. Denies issues with mask fit or pressure setting. He uses nasal pillow mask. Acute complaints of sinusitis symptoms. He is mouth breathing with CPAP d/t congestion and waking up with dry mouth. He was just started on Nasacort and allergra. He is not taking these consistently yet. He has humidification with CPAP. Denies sinus pain, rare headache.   He is not taking ambien. Normal bedtime is between 11pm-1am; he wakes up around 9am. He used to work shift work, now working from home. He gets 7-8 hours of sleep a night. He used to require daily naps but now only takes a nap once a week or once every other week.     He was started on Adderall back in December for ADD. He last filled provigil in February 2021. He is requesting a refill of this medication today. He is taking clonazepam for anxiety 0.25mg , he does not take this every day. Uses it for panic attack. He is trying taper off medication.     Significant testing: PFT 08/09/17 >> FEV1 3.26 (84%), FEV1% 82, TC 5.77 (88%), DLCO 119%, +BD  PSG 04/13/05 >>  AHI 60  Echo 09/09/17 >> EF 60 to 65%, grade 1 DD  Allergies  Allergen Reactions  . Aspirin     Face swelling     Immunization History  Administered Date(s) Administered  . Influenza Split 03/10/2012  . Influenza,inj,Quad PF,6+ Mos 05/20/2017, 02/28/2018, 03/19/2019    Past Medical History:  Diagnosis Date  . Allergic rhinitis   . Asthma   . EE (eosinophilic esophagitis) 6/81/2751  . Gastroesophageal reflux disease with esophagitis 09/28/2014  . GERD (gastroesophageal reflux disease) 08/20/2011  . HLD (hyperlipidemia)   . Hypertension   . Mild intermittent asthma without complication 7/00/1749  . Multiple gastric polyps 08/20/2011  . Obesity   . OSA on CPAP   . Peptic stricture of esophagus 09/28/2014  . Prediabetes 12/31/2016    Tobacco History: Social History   Tobacco Use  Smoking Status Former Smoker  . Packs/day: 1.00  . Years: 10.00  . Pack years: 10.00  . Types: Cigarettes  . Quit date: 06/05/1995  . Years since quitting: 24.6  Smokeless Tobacco Never Used   Counseling given: Not Answered   Outpatient Medications Prior to Visit  Medication Sig Dispense Refill  . amLODipine (NORVASC) 10 MG tablet Take 1 tablet (10 mg total) by mouth daily. 90 tablet 3  . amphetamine-dextroamphetamine (ADDERALL) 20 MG tablet Take 1 tablet (20 mg total) by mouth daily. At 2 PM 30 tablet 0  . amphetamine-dextroamphetamine (ADDERALL) 30 MG tablet Take 1 tablet by  mouth daily before breakfast. 30 tablet 0  . clonazePAM (KLONOPIN) 1 MG tablet 1/2 - 1 tab by mouth twice per day as needed 60 tablet 1  . fexofenadine (ALLEGRA) 180 MG tablet Take 1 tablet (180 mg total) by mouth daily. 30 tablet 11  . losartan (COZAAR) 100 MG tablet Take 1 tablet (100 mg total) by mouth daily. 90 tablet 3  . modafinil (PROVIGIL) 200 MG tablet Take 2 tablets (400 mg total) by mouth daily. 60 tablet 5  . rosuvastatin (CRESTOR) 20 MG tablet Take 1 tablet (20 mg total) by mouth daily. 90 tablet 3  .  traZODone (DESYREL) 50 MG tablet Take 0.5-1 tablets (25-50 mg total) by mouth at bedtime as needed for sleep. 90 tablet 1  . triamcinolone (NASACORT) 55 MCG/ACT AERO nasal inhaler Place 2 sprays into the nose daily. 1 Inhaler 12  . vitamin B-12 (CYANOCOBALAMIN) 1000 MCG tablet Take 1 tablet (1,000 mcg total) by mouth daily. 90 tablet 3  . albuterol (VENTOLIN HFA) 108 (90 Base) MCG/ACT inhaler TAKE 2 PUFFS BY MOUTH EVERY 6 HOURS AS NEEDED 8.5 g 3  . zolpidem (AMBIEN) 10 MG tablet Take 1 tablet (10 mg total) by mouth at bedtime as needed for sleep. 30 tablet 5  . citalopram (CELEXA) 20 MG tablet Take 1 tablet (20 mg total) by mouth daily. (Patient not taking: Reported on 01/07/2020) 90 tablet 3   No facility-administered medications prior to visit.   Review of Systems  Review of Systems  HENT: Positive for congestion, postnasal drip and sinus pressure. Negative for sinus pain.   Respiratory: Positive for cough and chest tightness. Negative for shortness of breath and wheezing.    Physical Exam  BP (!) 142/68 (BP Location: Left Arm, Cuff Size: Normal)   Pulse (!) 109   Temp 98.5 F (36.9 C) (Oral)   Ht 5\' 9"  (1.753 m)   Wt 253 lb 3.2 oz (114.9 kg)   SpO2 98%   BMI 37.39 kg/m  Physical Exam Constitutional:      General: He is not in acute distress.    Appearance: Normal appearance. He is obese. He is not ill-appearing.  HENT:     Head: Normocephalic and atraumatic.     Mouth/Throat:     Mouth: Mucous membranes are moist.     Pharynx: Oropharynx is clear. No oropharyngeal exudate or posterior oropharyngeal erythema.  Cardiovascular:     Rate and Rhythm: Normal rate and regular rhythm.  Pulmonary:     Effort: Pulmonary effort is normal.     Breath sounds: Normal breath sounds. No wheezing or rhonchi.  Musculoskeletal:     Cervical back: Normal range of motion and neck supple.  Skin:    General: Skin is dry.  Neurological:     General: No focal deficit present.     Mental Status:  He is alert and oriented to person, place, and time. Mental status is at baseline.  Psychiatric:        Mood and Affect: Mood normal.        Thought Content: Thought content normal.        Judgment: Judgment normal.      Lab Results:  CBC    Component Value Date/Time   WBC 7.4 03/19/2019 1543   RBC 4.86 03/19/2019 1543   HGB 15.3 03/19/2019 1543   HCT 44.9 03/19/2019 1543   PLT 269.0 03/19/2019 1543   MCV 92.4 03/19/2019 1543   MCH 32.2 09/02/2015 1320   MCHC  34.1 03/19/2019 1543   RDW 13.7 03/19/2019 1543   LYMPHSABS 1.7 03/19/2019 1543   MONOABS 0.8 03/19/2019 1543   EOSABS 0.3 03/19/2019 1543   BASOSABS 0.1 03/19/2019 1543    BMET    Component Value Date/Time   NA 137 08/26/2019 1153   K 3.7 08/26/2019 1153   CL 103 08/26/2019 1153   CO2 29 08/26/2019 1153   GLUCOSE 128 (H) 08/26/2019 1153   BUN 6 08/26/2019 1153   CREATININE 0.96 08/26/2019 1153   CALCIUM 9.5 08/26/2019 1153   GFRNONAA >60 09/02/2015 1320   GFRAA >60 09/02/2015 1320    BNP No results found for: BNP  ProBNP    Component Value Date/Time   PROBNP 5.0 05/20/2017 0955    Imaging: No results found.   Assessment & Plan:   Obstructive sleep apnea - Patient reports compliance with CPAP use, he did not bring in SD card today. We will need this to verify that he is using appropriate and ensure no changes need to be made - DME company is [facility]   Seasonal allergic rhinitis - Patient reports sinusitis symptoms are causing some difficulty at night esp when using CPAP. Encourage patient to take Zyrtec 10mg  once daily and Nasacort at bedtime as prescribed regularly. Advised he add ocean nasal spray twice daily. If not improvement in acute sinusitis symptoms may consider abx or referral to ENT   Hypersomnia with sleep apnea - Patient was prescribed Provigil for day sleepiness, however, he is currently taking Adderrall for ADD symptoms prescribed by PCP. I will discuss if refill is  appropriate with Dr. Halford Chessman. His epworth score is low. No RX provided today.     Martyn Ehrich, NP 01/07/2020

## 2020-01-08 ENCOUNTER — Telehealth: Payer: Self-pay | Admitting: Primary Care

## 2020-01-08 DIAGNOSIS — J309 Allergic rhinitis, unspecified: Secondary | ICD-10-CM | POA: Diagnosis not present

## 2020-01-08 DIAGNOSIS — R070 Pain in throat: Secondary | ICD-10-CM | POA: Diagnosis not present

## 2020-01-08 NOTE — Telephone Encounter (Signed)
Called patient to discuss symptoms ,  Sore throat , difficulty swallowing , shortness of breath -  COVID vaccine yesterday .  May use salt water gargles, and delsym As needed  Cough /sore throat  Cont on zyrtec daily   However with difficulty swallowing, inability to evaluation on phone.  Depression/panic attach hx - hestitant to add steroids at this time.  No notable swelling reported.   Concern for possible infectious pharyngitis , allergic reaction , etc. Difficulty to determine over the phone . Suggest urgent care/ER eval for better evaluation /tx plan.   Office is closed due to power outage.  Patient verbally upset , support provided.   Please contact office for sooner follow up if symptoms do not improve or worsen or seek emergency care

## 2020-01-08 NOTE — Telephone Encounter (Signed)
Checked [facility], pt is not in our system. LMTCB x1 for [facility].

## 2020-01-08 NOTE — Telephone Encounter (Signed)
Spoke with pt. He is not happy with Tammy's response. States that he thinks his message got mis communicated. Pt wants to have something to help with coughing. States that his throat irritation, dryness and "throat swelling" are coming from the coughing. Stated, "I just want something to help with the coughing and throat swelling. There's no way I am going to the ED. They aren't going to do anything for me." He was seen in office yesterday by [facility].  Tammy - please advise. Thanks.

## 2020-01-08 NOTE — Telephone Encounter (Signed)
Patient seen yesterday by NP Volanda Napoleon, reporting that symptoms are worse. Patient has history of OCD and anxiety disorder having panic attacks regarding airway, using Klonopin today. Reports sore throat, difficulty swallowing, cough with clear secretions, nasal congestion, Albuterol provides some relief, got Covid vaccine yesterday, no fever. He does not sound in any distress, breathing is not labored, he does not report any swelling in the back of his throat. Asking if he needs a steroid? Please advise

## 2020-01-08 NOTE — Telephone Encounter (Signed)
Need ER evaluation -if having difficulty swallowing severe panic attacks in shortness of breath will need emergency room evaluation  Please contact office for sooner follow up if symptoms do not improve or worsen or seek emergency care

## 2020-01-11 ENCOUNTER — Telehealth: Payer: Self-pay

## 2020-01-11 NOTE — Telephone Encounter (Signed)
Sent to Dr. John. 

## 2020-01-11 NOTE — Telephone Encounter (Signed)
Okay to send referral to ENT.  He would need ROV with me to discuss other issues regarding stimulant medications.

## 2020-01-11 NOTE — Telephone Encounter (Signed)
He can f/u with his PCP for management of adderall and then determine whether he needs to continue provigil.  Why does he need referral to ENT?

## 2020-01-11 NOTE — Telephone Encounter (Signed)
I called and spoke with the pt  I notified of Dr Juanetta Gosling response regarding adderall and provigil  He states that his PCP wants [place] to see his pt regarding these meds  He also asked about referral to ENT- needs due to difficulty with swallowing  He requests that this referral be made asap  Please see phone note from TP---Called patient to discuss symptoms ,  Sore throat , difficulty swallowing , shortness of breath -  COVID vaccine yesterday .  May use salt water gargles, and delsym As needed  Cough /sore throat  Cont on zyrtec daily   However with difficulty swallowing, inability to evaluation on phone.  Depression/panic attach hx - hestitant to add steroids at this time.  No notable swelling reported.   Concern for possible infectious pharyngitis , allergic reaction , etc. Difficulty to determine over the phone . Suggest urgent care/ER eval for better evaluation /tx plan.   Office is closed due to power outage.  Patient verbally upset , support provided.   Please contact office for sooner follow up if symptoms do not improve or worsen or seek emergency care   Please advise thanks

## 2020-01-11 NOTE — Progress Notes (Signed)
Reviewed and agree with assessment/plan.   Chesley Mires, MD [facility] Pulmonary/Critical Care 01/11/2020, 7:14 AM Pager:  [phone]

## 2020-01-11 NOTE — Telephone Encounter (Signed)
Spoke with patient, advised of ENT referral placed, prefers [facility] ENT.  Patient has f/u with Dr. Halford Chessman scheduled for 02/24/20.  Nothing further needed at this time.

## 2020-01-11 NOTE — Telephone Encounter (Signed)
He doesn't need both provigil and adderall.  I suspect he would want to continue adderall.  So we can d/c provigil.

## 2020-01-11 NOTE — Telephone Encounter (Signed)
ATC, left message for patient to get further information about need for ENT referral for Dr. Halford Chessman.  Also need to relay information to him regarding Adderall and Provigil from Dr. Halford Chessman.  Will await a return call.

## 2020-01-11 NOTE — Telephone Encounter (Signed)
I would agree with previous advice per EMR per Tammy Parrett to go to UC now or ED

## 2020-01-11 NOTE — Telephone Encounter (Signed)
My thoughts. Thanks  Triage please let patient know I discussed with Dr. Halford Chessman. Not clinically indicated to be on both medication. He can stay on one medication, likely discontinue Provigil as he has not filled this since February. Please remove from medication list.

## 2020-01-11 NOTE — Telephone Encounter (Signed)
New message    The patient is asking for ENT referral  due to throat swelling / problem swallowing.

## 2020-01-11 NOTE — Telephone Encounter (Signed)
Thanks

## 2020-01-11 NOTE — Telephone Encounter (Signed)
Spoke with patient to provide recommendations per Dr. Halford Chessman and Derl Barrow NP.  He stated that he did not agree with just taking Adderall.  He states that if he just takes Adderall, he still has daytime sleepiness, it helps his focus, but does not keep him awake.  He feels he needs the Adderall and the Provigil.  I offered to make him an appointment with Dr. Halford Chessman, he declined stating that after his last appointment with the NP, he was considering finding another doctor.  He states after having a visit with the NP, he still had to go to the NP and his PCP.  He is also requesting a referral to ENT, please advise if the referral is ok.  Advised I would make Dr. Halford Chessman aware of his concerns and would follow up with him after hearing back from Dr. Halford Chessman.

## 2020-01-12 NOTE — Telephone Encounter (Signed)
ATC [facility], was left on hold with no one coming to the line. Will try back.

## 2020-01-13 NOTE — Telephone Encounter (Signed)
Spoke with [facility]  States that the pt has respironics machine not resmed so not in [place] or [facility]  Will need card to DL  ATC the pt and there was NA, unable to leave msg

## 2020-01-14 NOTE — Telephone Encounter (Signed)
Called and spoke to pt. Informed him that the SD card will need to be brought in for a DL. Pt states he has an upcoming appt and would wait until then to bring it in. Pt states he doesn't feel the appt is necessary and is just for a Provigil 200mg  refill. Pt denies any complaints at this time. Pt states he takes both Adderall and Provigil (Adderral filled by PCP). Pt will not bring in SD card until he hears back from [place] if he needs to keep appt or not. Pt last seen 01/07/2020 by Derl Barrow, NP, and was advised to follow up in a year.   Dr. Halford Chessman please advise if ok to refill Provigil and cancel 02/24/20 appt. Thanks.    Provigil 200mg  - pt taking 400mg  qd.  Last filled on 02/06/2019 by Dr. Estell Harpin for #60 with 5 refills.

## 2020-01-16 ENCOUNTER — Other Ambulatory Visit: Payer: Self-pay | Admitting: Internal Medicine

## 2020-01-17 ENCOUNTER — Other Ambulatory Visit: Payer: Self-pay | Admitting: Internal Medicine

## 2020-01-18 ENCOUNTER — Other Ambulatory Visit: Payer: Self-pay | Admitting: Internal Medicine

## 2020-01-18 DIAGNOSIS — R070 Pain in throat: Secondary | ICD-10-CM | POA: Diagnosis not present

## 2020-01-18 DIAGNOSIS — K219 Gastro-esophageal reflux disease without esophagitis: Secondary | ICD-10-CM | POA: Diagnosis not present

## 2020-01-18 NOTE — Telephone Encounter (Signed)
Called and spoke with pt letting him know the info stated by VS and that he does need to keep f/u appt as scheduled. Pt verbalized understanding.  While speaking with pt, pt requested to have supervisor call him as he feels like he should be refunded his copay from his last OV 01/07/20. Pt states that he feels like that visit was a waste of time and does not feel like any of his problems/issues were addressed and nothing was resolved.  Stated to pt that I would see if someone could call him and he verbalized understanding. Routing message to [facility].

## 2020-01-18 NOTE — Telephone Encounter (Signed)
He needs an ROV to further assess why he needs so much stimulant medication - adderall plus provigil.  I would not feel comfortable refilling provigil while he is on adderall w/o doing further assessment of his current status to make sure there isn't any other issues that could be contributing to his daytime sleepiness that need to be addressed.

## 2020-01-19 MED ORDER — AMPHETAMINE-DEXTROAMPHETAMINE 20 MG PO TABS: 20.0000 mg | ORAL_TABLET | Freq: Every day | ORAL | 0 refills | Status: DC

## 2020-01-19 MED ORDER — AMPHETAMINE-DEXTROAMPHETAMINE 30 MG PO TABS: 30.0000 mg | ORAL_TABLET | Freq: Every day | ORAL | 0 refills | Status: DC

## 2020-01-19 NOTE — Telephone Encounter (Signed)
Done erx 

## 2020-01-25 NOTE — Telephone Encounter (Signed)
This has been given to the Surveyor, quantity as this is out of my hands.

## 2020-01-26 ENCOUNTER — Ambulatory Visit (INDEPENDENT_AMBULATORY_CARE_PROVIDER_SITE_OTHER): Payer: BC Managed Care – PPO | Admitting: Otolaryngology

## 2020-02-13 NOTE — Telephone Encounter (Signed)
Lauren and Kathlee Nations, please advise if this was ever taken care of.

## 2020-02-14 ENCOUNTER — Other Ambulatory Visit: Payer: Self-pay | Admitting: Internal Medicine

## 2020-02-24 ENCOUNTER — Ambulatory Visit: Payer: BC Managed Care – PPO | Admitting: Pulmonary Disease

## 2020-02-27 ENCOUNTER — Other Ambulatory Visit: Payer: Self-pay | Admitting: Internal Medicine

## 2020-02-29 ENCOUNTER — Ambulatory Visit: Payer: BC Managed Care – PPO | Admitting: Adult Health

## 2020-02-29 ENCOUNTER — Encounter: Payer: Self-pay | Admitting: Adult Health

## 2020-02-29 ENCOUNTER — Other Ambulatory Visit: Payer: Self-pay

## 2020-02-29 VITALS — BP 138/68 | HR 130 | Temp 97.4°F | Ht 69.0 in | Wt 261.0 lb

## 2020-02-29 DIAGNOSIS — G4710 Hypersomnia, unspecified: Secondary | ICD-10-CM

## 2020-02-29 DIAGNOSIS — G4730 Sleep apnea, unspecified: Secondary | ICD-10-CM

## 2020-02-29 DIAGNOSIS — R000 Tachycardia, unspecified: Secondary | ICD-10-CM

## 2020-02-29 DIAGNOSIS — G4733 Obstructive sleep apnea (adult) (pediatric): Secondary | ICD-10-CM

## 2020-02-29 MED ORDER — AMPHETAMINE-DEXTROAMPHETAMINE 20 MG PO TABS
20.0000 mg | ORAL_TABLET | Freq: Every day | ORAL | 0 refills | Status: DC
Start: 2020-02-29 — End: 2020-03-11

## 2020-02-29 MED ORDER — AMPHETAMINE-DEXTROAMPHETAMINE 30 MG PO TABS
30.0000 mg | ORAL_TABLET | Freq: Every day | ORAL | 0 refills | Status: DC
Start: 2020-02-29 — End: 2020-03-11

## 2020-02-29 NOTE — Assessment & Plan Note (Signed)
Tachycardia patient has no perceived symptoms he denies any chest pain palpitations presyncope syncope. EKG shows sinus tachycardia.  Could be secondary to stimulant use however have explained that he needs further evaluation and work-up.  Have asked him to follow-up immediately with his primary care provider.  Plan  Patient Instructions  Continue on CPAP At bedtime.  Work on healthy weight  Do not drive if sleepy .  Please bring in your SD card for CPAP download   Continue follow up with Dr. [name] for ongoing refills of Adderall Follow up with Dr. [name] regarding Tachycardia /fast heartbeat.  Would decrease caffeine intake .   Follow up with Dr. Halford Chessman in 1 year and As needed

## 2020-02-29 NOTE — Assessment & Plan Note (Signed)
Weight loss is encouraged

## 2020-02-29 NOTE — Patient Instructions (Addendum)
Continue on CPAP At bedtime.  Work on healthy weight  Do not drive if sleepy .  Please bring in your SD card for CPAP download   Continue follow up with Dr. [name] for ongoing refills of Adderall Follow up with Dr. [name] regarding Tachycardia /fast heartbeat.  Would decrease caffeine intake .   Follow up with Dr. Halford Chessman in 1 year and As needed

## 2020-02-29 NOTE — Progress Notes (Signed)
Reviewed and agree with assessment/plan.   Chesley Mires, MD [facility] Pulmonary/Critical Care 02/29/2020, 4:16 PM Pager:  [phone]

## 2020-02-29 NOTE — Assessment & Plan Note (Signed)
Severe obstructive sleep apnea.  Patient has ongoing daytime hypersomnolence despite reported good CPAP compliance.  Have requested a CPAP download.  Patient is aware to bring his SD card in for a download  Plan  Patient Instructions  Continue on CPAP At bedtime.  Work on healthy weight  Do not drive if sleepy .  Please bring in your SD card for CPAP download   Continue follow up with Dr. [name] for ongoing refills of Adderall Follow up with Dr. [name] regarding Tachycardia /fast heartbeat.  Would decrease caffeine intake .   Follow up with Dr. Halford Chessman in 1 year and As needed

## 2020-02-29 NOTE — Assessment & Plan Note (Signed)
Hypersomnia previously on Provigil however patient is now on Adderall.  Long discussion with patient regarding stimulant use and potential side effects and complications Patient was verbally upset regarding today's visit and inability to have a refill of Provigil.  Try to explain this was for his best interest as he does have tachycardia which could be a result of his ongoing stimulant use. Case was discussed with Dr. Halford Chessman in detail with agreement of not adding in another stimulant. Patient has been requested to bring in his CPAP SD card  for a download.  Plan  Patient Instructions  Continue on CPAP At bedtime.  Work on healthy weight  Do not drive if sleepy .  Please bring in your SD card for CPAP download   Continue follow up with Dr. [name] for ongoing refills of Adderall Follow up with Dr. [name] regarding Tachycardia /fast heartbeat.  Would decrease caffeine intake .   Follow up with Dr. Halford Chessman in 1 year and As needed

## 2020-02-29 NOTE — Progress Notes (Signed)
@Patient  ID: Bryan Barber, male    DOB: [birth year], 49 y.o.   MRN: [id number]  Chief Complaint  Patient presents with  . Follow-up    OSA     Referring provider: Biagio Borg, MD  HPI: 49 year old male followed for obstructive sleep apnea and daytime hypersomnolence  TEST/EVENTS :  PSG November 2006 AHI 60/hour  02/29/2020 follow-up obstructive sleep apnea and daytime hypersomnolence Patient returns for a follow-up visit.  Patient has underlying severe obstructive sleep apnea is on nocturnal CPAP.  He also has daytime hypersomnolence.  Had previously been on Provigil.  He was diagnosed with attention deficit disorder and started on Adderall up to 50 mg daily.  Patient was seen 1 month ago at that visit Provigil was declined due to him taking Adderall .  Patient returns today requesting a refill of his Provigil.  Long discussion with patient regarding dangers of concomitant stimulant use. He also had been requested to bring in his CPAP SD card for a download to see if his hypersomnolence could be related to other issues such as poorly controlled sleep apnea.  Unfortunately he did not bring in his SD card .  Patient does tell me that his CPAP machine has been recalled.  He is currently in the process of completing the recall process On exam today heart rate is elevated at 130 bpm.  An EKG was done that showed sinus tachycardia with heart rate at 123.  Patient denies any chest pain, headache, palpitations, presyncope, syncope.    Allergies  Allergen Reactions  . Aspirin     Face swelling     Immunization History  Administered Date(s) Administered  . Influenza Split 03/10/2012  . Influenza,inj,Quad PF,6+ Mos 05/20/2017, 02/28/2018, 03/19/2019  . PFIZER SARS-COV-2 Vaccination 01/07/2020, 01/28/2020    Past Medical History:  Diagnosis Date  . Allergic rhinitis   . Asthma   . EE (eosinophilic esophagitis) 1/44/8185  . Gastroesophageal reflux disease with esophagitis 09/28/2014    . GERD (gastroesophageal reflux disease) 08/20/2011  . HLD (hyperlipidemia)   . Hypertension   . Mild intermittent asthma without complication 6/31/4970  . Multiple gastric polyps 08/20/2011  . Obesity   . OSA on CPAP   . Peptic stricture of esophagus 09/28/2014  . Prediabetes 12/31/2016    Tobacco History: Social History   Tobacco Use  Smoking Status Former Smoker  . Packs/day: 1.00  . Years: 10.00  . Pack years: 10.00  . Types: Cigarettes  . Quit date: 06/05/1995  . Years since quitting: 24.7  Smokeless Tobacco Never Used   Counseling given: Not Answered   Outpatient Medications Prior to Visit  Medication Sig Dispense Refill  . albuterol (VENTOLIN HFA) 108 (90 Base) MCG/ACT inhaler TAKE 2 PUFFS BY MOUTH EVERY 6 HOURS AS NEEDED 8.5 g 3  . amLODipine (NORVASC) 10 MG tablet TAKE 1 TABLET BY MOUTH EVERY DAY 90 tablet 3  . amphetamine-dextroamphetamine (ADDERALL) 20 MG tablet Take 1 tablet (20 mg total) by mouth daily. At 2 PM 30 tablet 0  . amphetamine-dextroamphetamine (ADDERALL) 30 MG tablet Take 1 tablet by mouth daily before breakfast. 30 tablet 0  . clonazePAM (KLONOPIN) 1 MG tablet 1/2 - 1 tab by mouth twice per day as needed 60 tablet 1  . fexofenadine (ALLEGRA) 180 MG tablet Take 1 tablet (180 mg total) by mouth daily. 30 tablet 11  . losartan (COZAAR) 100 MG tablet Take 1 tablet (100 mg total) by mouth daily. 90 tablet 3  . modafinil (  PROVIGIL) 200 MG tablet Take 2 tablets (400 mg total) by mouth daily. 60 tablet 5  . rosuvastatin (CRESTOR) 20 MG tablet Take 1 tablet (20 mg total) by mouth daily. 90 tablet 3  . traZODone (DESYREL) 50 MG tablet TAKE 0.5-1 TABLETS (25-50 MG TOTAL) BY MOUTH AT BEDTIME AS NEEDED FOR SLEEP. 90 tablet 1  . triamcinolone (NASACORT) 55 MCG/ACT AERO nasal inhaler Place 2 sprays into the nose daily. 1 Inhaler 12  . vitamin B-12 (CYANOCOBALAMIN) 1000 MCG tablet Take 1 tablet (1,000 mcg total) by mouth daily. 90 tablet 3  . citalopram (CELEXA) 20 MG  tablet Take 1 tablet (20 mg total) by mouth daily. (Patient not taking: Reported on 01/07/2020) 90 tablet 3   No facility-administered medications prior to visit.     Review of Systems:   Constitutional:   No  weight loss, night sweats,  Fevers, chills, +fatigue, or  lassitude.  HEENT:   No headaches,  Difficulty swallowing,  Tooth/dental problems, or  Sore throat,                No sneezing, itching, ear ache, nasal congestion, post nasal drip,   CV:  No chest pain,  Orthopnea, PND, swelling in lower extremities, anasarca, dizziness, palpitations, syncope.   GI  No heartburn, indigestion, abdominal pain, nausea, vomiting, diarrhea, change in bowel habits, loss of appetite, bloody stools.   Resp: No shortness of breath with exertion or at rest.  No excess mucus, no productive cough,  No non-productive cough,  No coughing up of blood.  No change in color of mucus.  No wheezing.  No chest wall deformity  Skin: no rash or lesions.  GU: no dysuria, change in color of urine, no urgency or frequency.  No flank pain, no hematuria   MS:  No joint pain or swelling.  No decreased range of motion.  No back pain.    Physical Exam  BP 138/68 (BP Location: Left Arm, Cuff Size: Large)   Pulse (!) 130   Temp (!) 97.4 F (36.3 C) (Oral)   Ht 5\' 9"  (1.753 m)   Wt 261 lb (118.4 kg)   SpO2 98%   BMI 38.54 kg/m   GEN: A/Ox3; pleasant , NAD, well nourished    HEENT:  [place]/AT,   NOSE-clear, THROAT-clear, no lesions, no postnasal drip or exudate noted.   NECK:  Supple w/ fair ROM; no JVD; normal carotid impulses w/o bruits; no thyromegaly or nodules palpated; no lymphadenopathy.    RESP  Clear  P & A; w/o, wheezes/ rales/ or rhonchi. no accessory muscle use, no dullness to percussion  CARD:  RRR, no m/r/g, no peripheral edema, pulses intact, no cyanosis or clubbing.  GI:   Soft & nt; nml bowel sounds; no organomegaly or masses detected.   Musco: Warm bil, no deformities or joint swelling  noted.   Neuro: alert, no focal deficits noted.    Skin: Warm, no lesions or rashes  EKG heart rate 123 sinus tachycardia nonspecific ST changes.  Lab Results:  CBC  BNP No results found for: BNP  ProBNP  Imaging: No results found.    PFT Results Latest Ref Rng & Units 08/09/2017  FVC-Pre L 3.74  FVC-Predicted Pre % 76  FVC-Post L 3.96  FVC-Predicted Post % 81  Pre FEV1/FVC % % 79  Post FEV1/FCV % % 82  FEV1-Pre L 2.96  FEV1-Predicted Pre % 76  FEV1-Post L 3.26  DLCO uncorrected ml/min/mmHg 35.46  DLCO UNC% %  119  DLVA Predicted % 136  TLC L 5.77  TLC % Predicted % 88  RV % Predicted % 112    No results found for: NITRICOXIDE      Assessment & Plan:   Obstructive sleep apnea Severe obstructive sleep apnea.  Patient has ongoing daytime hypersomnolence despite reported good CPAP compliance.  Have requested a CPAP download.  Patient is aware to bring his SD card in for a download  Plan  Patient Instructions  Continue on CPAP At bedtime.  Work on healthy weight  Do not drive if sleepy .  Please bring in your SD card for CPAP download   Continue follow up with Dr. [name] for ongoing refills of Adderall Follow up with Dr. [name] regarding Tachycardia /fast heartbeat.  Would decrease caffeine intake .   Follow up with Dr. Halford Chessman in 1 year and As needed        Hypersomnia with sleep apnea Hypersomnia previously on Provigil however patient is now on Adderall.  Long discussion with patient regarding stimulant use and potential side effects and complications Patient was verbally upset regarding today's visit and inability to have a refill of Provigil.  Try to explain this was for his best interest as he does have tachycardia which could be a result of his ongoing stimulant use. Case was discussed with Dr. Halford Chessman in detail with agreement of not adding in another stimulant. Patient has been requested to bring in his CPAP SD card  for a download.  Plan  Patient  Instructions  Continue on CPAP At bedtime.  Work on healthy weight  Do not drive if sleepy .  Please bring in your SD card for CPAP download   Continue follow up with Dr. [name] for ongoing refills of Adderall Follow up with Dr. [name] regarding Tachycardia /fast heartbeat.  Would decrease caffeine intake .   Follow up with Dr. Halford Chessman in 1 year and As needed        Obesity, Class II, BMI 35-39.9, with comorbidity Weight loss is encouraged  Tachycardia Tachycardia patient has no perceived symptoms he denies any chest pain palpitations presyncope syncope. EKG shows sinus tachycardia.  Could be secondary to stimulant use however have explained that he needs further evaluation and work-up.  Have asked him to follow-up immediately with his primary care provider.  Plan  Patient Instructions  Continue on CPAP At bedtime.  Work on healthy weight  Do not drive if sleepy .  Please bring in your SD card for CPAP download   Continue follow up with Dr. [name] for ongoing refills of Adderall Follow up with Dr. [name] regarding Tachycardia /fast heartbeat.  Would decrease caffeine intake .   Follow up with Dr. Halford Chessman in 1 year and As needed           Rexene Edison, NP 02/29/2020

## 2020-03-01 ENCOUNTER — Encounter: Payer: Self-pay | Admitting: Internal Medicine

## 2020-03-01 NOTE — Telephone Encounter (Signed)
Spoke with patient. He has a better understanding. I explained to him what to do if he wants to contest the co-pay. Patient verbalized understanding.  Nothing further needed at this time.

## 2020-03-02 ENCOUNTER — Other Ambulatory Visit: Payer: Self-pay | Admitting: Internal Medicine

## 2020-03-02 ENCOUNTER — Ambulatory Visit: Payer: BC Managed Care – PPO | Admitting: Internal Medicine

## 2020-03-02 NOTE — Telephone Encounter (Signed)
Please refill as per office routine med refill policy (all routine meds refilled for 3 mo or monthly per pt preference up to one year from last visit, then month to month grace period for 3 mo, then further med refills will have to be denied)  

## 2020-03-03 DIAGNOSIS — R070 Pain in throat: Secondary | ICD-10-CM | POA: Diagnosis not present

## 2020-03-03 DIAGNOSIS — K219 Gastro-esophageal reflux disease without esophagitis: Secondary | ICD-10-CM | POA: Diagnosis not present

## 2020-03-08 ENCOUNTER — Ambulatory Visit: Payer: BC Managed Care – PPO | Admitting: Pulmonary Disease

## 2020-03-10 ENCOUNTER — Ambulatory Visit: Payer: BC Managed Care – PPO | Admitting: Internal Medicine

## 2020-03-11 ENCOUNTER — Encounter: Payer: Self-pay | Admitting: Internal Medicine

## 2020-03-11 ENCOUNTER — Ambulatory Visit: Payer: BC Managed Care – PPO | Admitting: Internal Medicine

## 2020-03-11 ENCOUNTER — Other Ambulatory Visit: Payer: Self-pay

## 2020-03-11 VITALS — BP 130/86 | HR 103 | Temp 98.0°F | Ht 69.0 in | Wt 261.0 lb

## 2020-03-11 DIAGNOSIS — Z23 Encounter for immunization: Secondary | ICD-10-CM | POA: Diagnosis not present

## 2020-03-11 DIAGNOSIS — E538 Deficiency of other specified B group vitamins: Secondary | ICD-10-CM | POA: Diagnosis not present

## 2020-03-11 DIAGNOSIS — E559 Vitamin D deficiency, unspecified: Secondary | ICD-10-CM

## 2020-03-11 DIAGNOSIS — Z0001 Encounter for general adult medical examination with abnormal findings: Secondary | ICD-10-CM

## 2020-03-11 DIAGNOSIS — I10 Essential (primary) hypertension: Secondary | ICD-10-CM | POA: Diagnosis not present

## 2020-03-11 DIAGNOSIS — F988 Other specified behavioral and emotional disorders with onset usually occurring in childhood and adolescence: Secondary | ICD-10-CM

## 2020-03-11 DIAGNOSIS — F410 Panic disorder [episodic paroxysmal anxiety] without agoraphobia: Secondary | ICD-10-CM

## 2020-03-11 DIAGNOSIS — R7303 Prediabetes: Secondary | ICD-10-CM | POA: Diagnosis not present

## 2020-03-11 MED ORDER — MODAFINIL 200 MG PO TABS: 400.0000 mg | ORAL_TABLET | Freq: Every day | ORAL | 5 refills | Status: AC

## 2020-03-11 MED ORDER — CITALOPRAM HYDROBROMIDE 20 MG PO TABS: 20.0000 mg | ORAL_TABLET | Freq: Every day | ORAL | 3 refills | Status: AC

## 2020-03-11 MED ORDER — METOPROLOL SUCCINATE ER 100 MG PO TB24: 100.0000 mg | ORAL_TABLET | Freq: Every day | ORAL | 3 refills | Status: AC

## 2020-03-11 MED ORDER — FAMOTIDINE 20 MG PO TABS: 20.0000 mg | ORAL_TABLET | Freq: Every day | ORAL | 3 refills | Status: AC

## 2020-03-11 NOTE — Progress Notes (Signed)
Subjective:    Patient ID: Bryan Barber, male    DOB: 17-Nov-1970, 49 y.o.   MRN: [id number]  HPI  Here to f/u; overall doing ok,  Pt denies chest pain, increasing sob or doe, wheezing, orthopnea, PND, increased LE swelling, palpitations, dizziness or syncope, but has ongoing mild increased HR>  Pt denies new neurological symptoms such as new headache, or facial or extremity weakness or numbness.  Pt denies polydipsia, polyuria, or low sugar episode.  Pt states overall good compliance with meds, mostly trying to follow appropriate diet, with wt overall remains high, ahrd to lose despite exercise and diet.  Denies worsening depressive symptoms, suicidal ideation, or panic; has ongoing anxiety, willing to try celexa again, although he never really tried it the last time, just has bottles at home.  Asks for change adderall to modafinil, as adderall does not seem to help so much and the modafinil is more important with the alertness.  Asks for nutrition referral. Past Medical History:  Diagnosis Date  . Allergic rhinitis   . Asthma   . EE (eosinophilic esophagitis) 10/30/4130  . Gastroesophageal reflux disease with esophagitis 09/28/2014  . GERD (gastroesophageal reflux disease) 08/20/2011  . HLD (hyperlipidemia)   . Hypertension   . Mild intermittent asthma without complication 4/40/1027  . Multiple gastric polyps 08/20/2011  . Obesity   . OSA on CPAP   . Peptic stricture of esophagus 09/28/2014  . Prediabetes 12/31/2016   Past Surgical History:  Procedure Laterality Date  . ESOPHAGOGASTRODUODENOSCOPY (EGD) WITH ESOPHAGEAL DILATION  08/20/2011  . WISDOM TOOTH EXTRACTION    . WRIST SURGERY  10/2010   cyst removed. Right wrist    reports that he quit smoking about 24 years ago. His smoking use included cigarettes. He has a 10.00 pack-year smoking history. He has never used smokeless tobacco. He reports that he does not drink alcohol and does not use drugs. family history includes Atrial  fibrillation in his father; Cancer in his maternal grandmother; Heart disease in his paternal grandfather; Hypertension in his father; Pancreatic cancer in his maternal grandfather; Sleep apnea in his mother. Allergies  Allergen Reactions  . Aspirin     Face swelling    Current Outpatient Medications on File Prior to Visit  Medication Sig Dispense Refill  . albuterol (VENTOLIN HFA) 108 (90 Base) MCG/ACT inhaler TAKE 2 PUFFS BY MOUTH EVERY 6 HOURS AS NEEDED 8.5 g 3  . clonazePAM (KLONOPIN) 1 MG tablet 1/2 - 1 tab by mouth twice per day as needed 60 tablet 1  . fexofenadine (ALLEGRA) 180 MG tablet Take 1 tablet (180 mg total) by mouth daily. 30 tablet 11  . losartan (COZAAR) 100 MG tablet Take 1 tablet (100 mg total) by mouth daily. 90 tablet 3  . rosuvastatin (CRESTOR) 20 MG tablet TAKE 1 TABLET BY MOUTH EVERY DAY 90 tablet 3  . traZODone (DESYREL) 50 MG tablet TAKE 0.5-1 TABLETS (25-50 MG TOTAL) BY MOUTH AT BEDTIME AS NEEDED FOR SLEEP. 90 tablet 1  . triamcinolone (NASACORT) 55 MCG/ACT AERO nasal inhaler Place 2 sprays into the nose daily. 1 Inhaler 12  . vitamin B-12 (CYANOCOBALAMIN) 1000 MCG tablet Take 1 tablet (1,000 mcg total) by mouth daily. 90 tablet 3   No current facility-administered medications on file prior to visit.   Review of Systems All otherwise neg per pt    Objective:   Physical Exam BP 130/86 (BP Location: Left Arm, Patient Position: Sitting, Cuff Size: Large)   Pulse Marland Kitchen)  103   Temp 98 F (36.7 C) (Oral)   Ht 5\' 9"  (1.753 m)   Wt 261 lb (118.4 kg)   SpO2 97%   BMI 38.54 kg/m  VS noted,  Constitutional: Pt appears in NAD HENT: Head: NCAT.  Right Ear: External ear normal.  Left Ear: External ear normal.  Eyes: . Pupils are equal, round, and reactive to light. Conjunctivae and EOM are normal Nose: without d/c or deformity Neck: Neck supple. Gross normal ROM Cardiovascular: Normal rate and regular rhythm.   Pulmonary/Chest: Effort normal and breath sounds  without rales or wheezing.  Abd:  Soft, NT, ND, + BS, no organomegaly Neurological: Pt is alert. At baseline orientation, motor grossly intact Skin: Skin is warm. No rashes, other new lesions, no LE edema Psychiatric: Pt behavior is normal without agitation  All otherwise neg per pt Lab Results  Component Value Date   WBC 7.4 03/19/2019   HGB 15.3 03/19/2019   HCT 44.9 03/19/2019   PLT 269.0 03/19/2019   GLUCOSE 128 (H) 08/26/2019   CHOL 122 08/26/2019   TRIG 88.0 08/26/2019   HDL 39.10 08/26/2019   LDLDIRECT 183.0 03/19/2019   LDLCALC 66 08/26/2019   ALT 26 08/26/2019   AST 19 08/26/2019   NA 137 08/26/2019   K 3.7 08/26/2019   CL 103 08/26/2019   CREATININE 0.96 08/26/2019   BUN 6 08/26/2019   CO2 29 08/26/2019   TSH 1.94 03/19/2019   PSA 0.53 03/19/2019   HGBA1C 5.8 08/26/2019      Assessment & Plan:

## 2020-03-11 NOTE — Patient Instructions (Signed)
Ok to stop the amlodipine for blood pressure  Please take all new medication as prescribed - the toprol XL 100 mg per day for BP and heart rate (which also helps with anxiety after taking for several weeks)  Please take all new medication as prescribed - the celexa 20 mg per day - mostly for anxiety  Ok to just stop the adderall  Please take all new medication as prescribed - the modafinil for alertness  Please continue all other medications as before, and refills have been done if requested.  Please have the pharmacy call with any other refills you may need.  Please continue your efforts at being more active, low cholesterol diet, and weight control.  Please keep your appointments with your specialists as you may have planned  You will be contacted regarding the referral for: Nutrition (diabetes education, since you have "pre-diabetes)  We'll then see you at your next visit as scheduled!

## 2020-03-12 ENCOUNTER — Encounter: Payer: Self-pay | Admitting: Internal Medicine

## 2020-03-12 NOTE — Assessment & Plan Note (Signed)
Ok to d/c adderall, try modafinil for alertness and daytime somnolence

## 2020-03-12 NOTE — Assessment & Plan Note (Addendum)
stable overall by history and exam, recent data reviewed with pt, and pt to continue medical treatment as before,  to f/u any worsening symptoms or concerns, also for nutrition referral

## 2020-03-12 NOTE — Assessment & Plan Note (Signed)
Cont oral repalcement 

## 2020-03-12 NOTE — Assessment & Plan Note (Signed)
For oral b12

## 2020-03-12 NOTE — Assessment & Plan Note (Addendum)
Uncontrolled with mild sinus tachycardia likley psych related; for change amlodipine to toprol xl 10 qd  I spent 41 minutes in preparing to see the patient by review of recent labs, imaging and procedures, obtaining and reviewing separately obtained history, communicating with the patient and family or caregiver, ordering medications, tests or procedures, and documenting clinical information in the EHR including the differential Dx, treatment, and any further evaluation and other management of htn, add, panic anxiety, b12 and d defieciency, preDM

## 2020-03-12 NOTE — Assessment & Plan Note (Signed)
Ok to start celexa 20 qd, declines counseiln

## 2020-03-31 ENCOUNTER — Other Ambulatory Visit: Payer: Self-pay | Admitting: Internal Medicine

## 2020-03-31 NOTE — Telephone Encounter (Signed)
Done erx 

## 2020-04-08 DIAGNOSIS — Z79891 Long term (current) use of opiate analgesic: Secondary | ICD-10-CM | POA: Diagnosis not present

## 2020-04-08 DIAGNOSIS — F331 Major depressive disorder, recurrent, moderate: Secondary | ICD-10-CM | POA: Diagnosis not present

## 2020-04-08 DIAGNOSIS — F900 Attention-deficit hyperactivity disorder, predominantly inattentive type: Secondary | ICD-10-CM | POA: Diagnosis not present

## 2020-04-08 DIAGNOSIS — F410 Panic disorder [episodic paroxysmal anxiety] without agoraphobia: Secondary | ICD-10-CM | POA: Diagnosis not present

## 2020-05-24 ENCOUNTER — Other Ambulatory Visit: Payer: Self-pay | Admitting: Internal Medicine

## 2020-05-24 NOTE — Telephone Encounter (Signed)
Please refill as per office routine med refill policy (all routine meds refilled for 3 mo or monthly per pt preference up to one year from last visit, then month to month grace period for 3 mo, then further med refills will have to be denied)  

## 2020-06-20 ENCOUNTER — Other Ambulatory Visit: Payer: Self-pay | Admitting: Primary Care

## 2020-06-20 DIAGNOSIS — J4520 Mild intermittent asthma, uncomplicated: Secondary | ICD-10-CM

## 2020-06-22 ENCOUNTER — Other Ambulatory Visit: Payer: Self-pay | Admitting: Internal Medicine

## 2020-07-05 DIAGNOSIS — F332 Major depressive disorder, recurrent severe without psychotic features: Secondary | ICD-10-CM | POA: Diagnosis not present

## 2020-07-05 DIAGNOSIS — F410 Panic disorder [episodic paroxysmal anxiety] without agoraphobia: Secondary | ICD-10-CM | POA: Diagnosis not present

## 2020-07-05 DIAGNOSIS — F900 Attention-deficit hyperactivity disorder, predominantly inattentive type: Secondary | ICD-10-CM | POA: Diagnosis not present

## 2020-07-26 DIAGNOSIS — F900 Attention-deficit hyperactivity disorder, predominantly inattentive type: Secondary | ICD-10-CM | POA: Diagnosis not present

## 2020-07-26 DIAGNOSIS — F410 Panic disorder [episodic paroxysmal anxiety] without agoraphobia: Secondary | ICD-10-CM | POA: Diagnosis not present

## 2020-07-26 DIAGNOSIS — F331 Major depressive disorder, recurrent, moderate: Secondary | ICD-10-CM | POA: Diagnosis not present

## 2020-08-17 ENCOUNTER — Other Ambulatory Visit: Payer: Self-pay | Admitting: Internal Medicine

## 2020-08-17 NOTE — Telephone Encounter (Signed)
Ok to let pt know -   We had to change his losartan to micardis which should work the same, due to losartan on back order

## 2020-11-02 DEATH — deceased
